# Patient Record
Sex: Female | Born: 2000 | Hispanic: Yes | Marital: Single | Smoking: Never smoker
Health system: Southern US, Community
[De-identification: ages and names within clinical notes are randomized; demographics above are authoritative.]

## PROBLEM LIST (undated history)

## (undated) ENCOUNTER — Inpatient Hospital Stay (HOSPITAL_COMMUNITY): Payer: Medicaid Other

## (undated) DIAGNOSIS — F32A Depression, unspecified: Secondary | ICD-10-CM

## (undated) DIAGNOSIS — F419 Anxiety disorder, unspecified: Secondary | ICD-10-CM

## (undated) DIAGNOSIS — D649 Anemia, unspecified: Secondary | ICD-10-CM

## (undated) DIAGNOSIS — I10 Essential (primary) hypertension: Secondary | ICD-10-CM

## (undated) HISTORY — PX: NO PAST SURGERIES: SHX2092

---
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
## 2019-03-05 NOTE — L&D Delivery Note (Addendum)
OB/GYN Faculty Practice Delivery Note  Paige Stevens is a 19 y.o. G1P0 s/p induced vaginal at [redacted]w[redacted]d. She was admitted for IOL for gHTN.   GBS Status: Negative/-- (10/05 0932) Maximum Maternal Temperature: Temp (24hrs), Avg:98.9 F (37.2 C), Min:98.3 F (36.8 C), Max:99.4 F (37.4 C)  Labor Progress:  Outpatient FB  SROM at home with clear fluid around 2200 on 12/15/19  cytotec x 1   Pitocin until complete dilation achieved.   Delivery Date/Time: 12/17/2019 at 0337 Delivery: Called to room and patient was complete and pushing. Head delivered ROA. Loose Nuchal cord present x 1. Shoulder and body delivered in usual fashion. Infant with spontaneous cry, placed skin to skin on mother's abdomen, dried and stimulated. Cord clamped x 2 after 1-minute delay, and cut by FOB. Cord blood drawn. Placenta delivered spontaneously with gentle cord traction. Fundus firm with massage and Pitocin. Labia, perineum, vagina, and cervix inspected with right periurethral, left periclitoral, left vaginal tear all repaired with 3-0 vicryl. .   Placenta: spontaneous , intact  Complications: hypertension EBL: 525 mL  Analgesia: Epidural anesthesia  Postpartum Planning Mom and baby to mother/baby.   Lactation consult  AM CBC  Contraception: POPs   Circ declined    Social work: hx of depression/anxiety    Infant: Viable baby boy   APGAR: 8/9   3005 g  Genia Hotter, M.D.  12/17/2019 4:28 AM   I saw and evaluated the patient. I agree with the findings and the plan of care as documented in the residents note.  Casper Harrison, MD [facility] Family Medicine Fellow, [facility] for [facility], [facility]

## 2019-05-27 LAB — OB RESULTS CONSOLE ABO/RH: RH Type: POSITIVE

## 2019-05-27 LAB — OB RESULTS CONSOLE HIV ANTIBODY (ROUTINE TESTING): HIV: NONREACTIVE

## 2019-05-27 LAB — OB RESULTS CONSOLE RPR: RPR: NONREACTIVE

## 2019-05-27 LAB — OB RESULTS CONSOLE HGB/HCT, BLOOD
HCT: 37 (ref 29–41)
Hemoglobin: 12.3

## 2019-05-27 LAB — OB RESULTS CONSOLE RUBELLA ANTIBODY, IGM: Rubella: IMMUNE

## 2019-05-27 LAB — OB RESULTS CONSOLE VARICELLA ZOSTER ANTIBODY, IGG: Varicella: NON-IMMUNE/NOT IMMUNE

## 2019-05-27 LAB — OB RESULTS CONSOLE HEPATITIS B SURFACE ANTIGEN: Hepatitis B Surface Ag: NEGATIVE

## 2019-08-23 ENCOUNTER — Other Ambulatory Visit: Payer: Self-pay

## 2019-08-23 ENCOUNTER — Inpatient Hospital Stay (HOSPITAL_BASED_OUTPATIENT_CLINIC_OR_DEPARTMENT_OTHER): Payer: Medicaid Other

## 2019-08-23 ENCOUNTER — Inpatient Hospital Stay (HOSPITAL_COMMUNITY)
Admission: AD | Admit: 2019-08-23 | Discharge: 2019-08-23 | Disposition: A | Discharge status: Home / Self Care | Payer: Medicaid Other | Attending: Obstetrics and Gynecology | Admitting: Obstetrics and Gynecology

## 2019-08-23 ENCOUNTER — Encounter (HOSPITAL_COMMUNITY): Payer: Self-pay | Admitting: Obstetrics and Gynecology

## 2019-08-23 DIAGNOSIS — Z3A20 20 weeks gestation of pregnancy: Secondary | ICD-10-CM

## 2019-08-23 DIAGNOSIS — O26892 Other specified pregnancy related conditions, second trimester: Secondary | ICD-10-CM | POA: Diagnosis not present

## 2019-08-23 DIAGNOSIS — O209 Hemorrhage in early pregnancy, unspecified: Secondary | ICD-10-CM | POA: Insufficient documentation

## 2019-08-23 DIAGNOSIS — O4692 Antepartum hemorrhage, unspecified, second trimester: Secondary | ICD-10-CM

## 2019-08-23 DIAGNOSIS — Z8249 Family history of ischemic heart disease and other diseases of the circulatory system: Secondary | ICD-10-CM | POA: Insufficient documentation

## 2019-08-23 DIAGNOSIS — Z8349 Family history of other endocrine, nutritional and metabolic diseases: Secondary | ICD-10-CM | POA: Insufficient documentation

## 2019-08-23 DIAGNOSIS — Z6740 Type O blood, Rh positive: Secondary | ICD-10-CM | POA: Insufficient documentation

## 2019-08-23 DIAGNOSIS — N930 Postcoital and contact bleeding: Secondary | ICD-10-CM

## 2019-08-23 DIAGNOSIS — O99891 Other specified diseases and conditions complicating pregnancy: Secondary | ICD-10-CM

## 2019-08-23 DIAGNOSIS — Z3492 Encounter for supervision of normal pregnancy, unspecified, second trimester: Secondary | ICD-10-CM

## 2019-08-23 DIAGNOSIS — R8290 Unspecified abnormal findings in urine: Secondary | ICD-10-CM | POA: Insufficient documentation

## 2019-08-23 DIAGNOSIS — N939 Abnormal uterine and vaginal bleeding, unspecified: Secondary | ICD-10-CM | POA: Diagnosis present

## 2019-08-23 DIAGNOSIS — Z3689 Encounter for other specified antenatal screening: Secondary | ICD-10-CM

## 2019-08-23 HISTORY — DX: Anxiety disorder, unspecified: F41.9

## 2019-08-23 HISTORY — DX: Depression, unspecified: F32.A

## 2019-08-23 LAB — CBC
HCT: 30.3 % — ABNORMAL LOW (ref 36.0–46.0)
Hemoglobin: 10.4 g/dL — ABNORMAL LOW (ref 12.0–15.0)
MCH: 31.4 pg (ref 26.0–34.0)
MCHC: 34.3 g/dL (ref 30.0–36.0)
MCV: 91.5 fL (ref 80.0–100.0)
Platelets: 281 10*3/uL (ref 150–400)
RBC: 3.31 MIL/uL — ABNORMAL LOW (ref 3.87–5.11)
RDW: 12.9 % (ref 11.5–15.5)
WBC: 10.1 10*3/uL (ref 4.0–10.5)
nRBC: 0 % (ref 0.0–0.2)

## 2019-08-23 LAB — URINALYSIS, ROUTINE W REFLEX MICROSCOPIC
Bilirubin Urine: NEGATIVE
Glucose, UA: NEGATIVE mg/dL
Hgb urine dipstick: NEGATIVE
Ketones, ur: NEGATIVE mg/dL
Nitrite: NEGATIVE
Protein, ur: NEGATIVE mg/dL
Specific Gravity, Urine: 1.005 (ref 1.005–1.030)
Trans Epithel, UA: <1
pH: 7 (ref 5.0–8.0)

## 2019-08-23 LAB — WET PREP, GENITAL
Clue Cells Wet Prep HPF POC: NONE SEEN
Sperm: NONE SEEN
Trich, Wet Prep: NONE SEEN
Yeast Wet Prep HPF POC: NONE SEEN

## 2019-08-23 LAB — TYPE AND SCREEN
ABO/RH(D): O POS
Antibody Screen: NEGATIVE

## 2019-08-23 LAB — ABO/RH: ABO/RH(D): O POS

## 2019-08-23 NOTE — Discharge Instructions (Signed)
Prenatal Care Providers           Center for [facility] @ [facility]   Phone: [phone]  Center for [facility] @ [facility]   Phone: [phone]  Center For [facility] @[facility] [place] [facility]       Phone: [phone]            Center for [facility] @ [place]     Phone: [phone]          Center for [facility] @ [facility]   Phone: [phone]  Center for [facility] @ [facility]  Phone: [phone]  Center for [facility] @ [facility] Phone: [phone]     [facility]  Phone: [phone]  [facility]  Phone: [phone]  [phone] OB/GYN Phone: [phone]  [facility] Phone: [phone]  [facility] Phone: [phone]  [facility] Phone: [phone]  [facility]  Phone: [phone]

## 2019-08-23 NOTE — MAU Note (Signed)
Presents with VB and "light" cramping.  Reports currently not bleeding, but did once this morning.  Noted blood in toilet and with wiping.  Reports last intercourse yesterday.  Denies LOF.

## 2019-08-23 NOTE — MAU Provider Note (Addendum)
History     CSN: [id number]  Arrival date and time: 08/23/19 1035   First Provider Initiated Contact with Patient 08/23/19 1250      Chief Complaint  Patient presents with  . Vaginal Bleeding  . Abdominal Pain   HPI Paige Stevens is a 19 y.o. G1P0 at [redacted]w[redacted]d who presents to MAU with chief complaint of vaginal bleeding. She endorses sexual intercourse last night followed by new onset vaginal spotting this morning. She states she sees pink-tinged fluid when she wipes after voiding. She denies pain, heavy vaginal bleeding,, dysuria, fever or recent illness.  Patient has moved to [place] from [place], where she initiated prenatal care. She does not have a local [facility] Provider and requests a list.  OB History    Gravida  1   Para      Term      Preterm      AB      Living        SAB      TAB      Ectopic      Multiple      Live Births              Past Medical History:  Diagnosis Date  . Anxiety   . Depression     Past Surgical History:  Procedure Laterality Date  . NO PAST SURGERIES      Family History  Problem Relation Age of Onset  . Hypothyroidism Mother   . Hypertension Father     Social History   Tobacco Use  . Smoking status: Never Smoker  . Smokeless tobacco: Never Used  Vaping Use  . Vaping Use: Never used  Substance Use Topics  . Alcohol use: Never  . Drug use: Yes    Types: Marijuana    Comment: 2020    Allergies: No Known Allergies  No medications prior to admission.    Review of Systems  Genitourinary: Positive for vaginal bleeding.  All other systems reviewed and are negative.  Physical Exam   Blood pressure 123/71, pulse (!) 114, temperature 98.4 F (36.9 C), temperature source Oral, resp. rate 18, height 5\' 3"  (1.6 m), weight 72 kg, SpO2 97 %.  Physical Exam  Nursing note and vitals reviewed. Constitutional: She appears well-developed.  Cardiovascular: Normal heart sounds. Tachycardia present.   Respiratory: Effort normal and breath sounds normal.  GI: Soft. There is no abdominal tenderness.  Gravid  Genitourinary:    Vagina and cervix normal.     No vaginal discharge.     Genitourinary Comments: Pelvic exam: External genitalia normal, vaginal walls pink and well rugated, cervix visually closed, no lesions noted. No bleeding or blood-tinged discharge visualized. No contact bleeding with swab collection.    Neurological: She is alert.    MAU Course  Procedures: speculum exam, ultrasound  --First trimester documentation visible in Care Everywhere. Reviewed by me  Patient Vitals for the past 24 hrs:  BP Temp Temp src Pulse Resp SpO2 Height Weight  08/23/19 1308 -- 98.4 F (36.9 C) Oral -- 18 97 % -- --  08/23/19 1056 123/71 98.3 F (36.8 C) Oral (!) 114 19 100 % -- --  08/23/19 1046 -- -- -- -- -- -- 5\' 3"  (1.6 m) 72 kg   Results for orders placed or performed during the hospital encounter of 08/23/19 (from the past 24 hour(s))  Urinalysis, Routine w reflex microscopic     Status: Abnormal   Collection Time: 08/23/19 11:15 AM  Result Value Ref Range   Color, Urine YELLOW YELLOW   APPearance HAZY (A) CLEAR   Specific Gravity, Urine 1.005 1.005 - 1.030   pH 7.0 5.0 - 8.0   Glucose, UA NEGATIVE NEGATIVE mg/dL   Hgb urine dipstick NEGATIVE NEGATIVE   Bilirubin Urine NEGATIVE NEGATIVE   Ketones, ur NEGATIVE NEGATIVE mg/dL   Protein, ur NEGATIVE NEGATIVE mg/dL   Nitrite NEGATIVE NEGATIVE   Leukocytes,Ua LARGE (A) NEGATIVE   RBC / HPF 0-5 0 - 5 RBC/hpf   WBC, UA 6-10 0 - 5 WBC/hpf   Bacteria, UA FEW (A) NONE SEEN   Squamous Epithelial / LPF 6-10 0 - 5   Trans Epithel, UA <1   Wet prep, genital     Status: Abnormal   Collection Time: 08/23/19 11:27 AM  Result Value Ref Range   Yeast Wet Prep HPF POC NONE SEEN NONE SEEN   Trich, Wet Prep NONE SEEN NONE SEEN   Clue Cells Wet Prep HPF POC NONE SEEN NONE SEEN   WBC, Wet Prep HPF POC MODERATE (A) NONE SEEN   Sperm NONE  SEEN   CBC     Status: Abnormal   Collection Time: 08/23/19 11:45 AM  Result Value Ref Range   WBC 10.1 4.0 - 10.5 K/uL   RBC 3.31 (L) 3.87 - 5.11 MIL/uL   Hemoglobin 10.4 (L) 12.0 - 15.0 g/dL   HCT 30.3 (L) 36 - 46 %   MCV 91.5 80.0 - 100.0 fL   MCH 31.4 26.0 - 34.0 pg   MCHC 34.3 30.0 - 36.0 g/dL   RDW 12.9 11.5 - 15.5 %   Platelets 281 150 - 400 K/uL   nRBC 0.0 0.0 - 0.2 %  Type and screen     Status: None   Collection Time: 08/23/19 11:46 AM  Result Value Ref Range   ABO/RH(D) O POS    Antibody Screen NEG    Sample Expiration      08/26/2019,2359 Performed at [facility], [address], [place], [place] [address]   ABO/Rh     Status: None   Collection Time: 08/23/19 11:46 AM  Result Value Ref Range   ABO/RH(D)      O POS Performed at [place] [address], [place], [place] [address]     Assessment and Plan  --19 y.o. G1P0 at [redacted]w[redacted]d  --FHT 160 by Doppler --No concerning findings on physical exam  --No concerning findings on MFM  OB Limited --Abnormal UA, urine culture ordered per patient request --Blood type O POS --Discharge home in stable condition  Darlina Rumpf, CNM 08/23/2019, 2:52 PM

## 2019-08-24 LAB — CULTURE, OB URINE: Culture: NO GROWTH

## 2019-08-24 LAB — GC/CHLAMYDIA PROBE AMP (~~LOC~~) NOT AT ARMC
Chlamydia: NEGATIVE
Comment: NEGATIVE
Comment: NORMAL
Neisseria Gonorrhea: NEGATIVE

## 2019-09-13 ENCOUNTER — Other Ambulatory Visit: Payer: Self-pay

## 2019-09-13 ENCOUNTER — Encounter: Payer: Self-pay | Admitting: Advanced Practice Midwife

## 2019-09-13 ENCOUNTER — Ambulatory Visit (INDEPENDENT_AMBULATORY_CARE_PROVIDER_SITE_OTHER): Payer: Medicaid Other | Admitting: Advanced Practice Midwife

## 2019-09-13 VITALS — BP 122/77 | HR 103 | Wt 162.8 lb

## 2019-09-13 DIAGNOSIS — Z3482 Encounter for supervision of other normal pregnancy, second trimester: Secondary | ICD-10-CM

## 2019-09-13 DIAGNOSIS — Z3A23 23 weeks gestation of pregnancy: Secondary | ICD-10-CM

## 2019-09-13 DIAGNOSIS — Z3A24 24 weeks gestation of pregnancy: Secondary | ICD-10-CM | POA: Diagnosis not present

## 2019-09-13 DIAGNOSIS — Z3480 Encounter for supervision of other normal pregnancy, unspecified trimester: Secondary | ICD-10-CM | POA: Insufficient documentation

## 2019-09-13 MED ORDER — BLOOD PRESSURE KIT DEVI: 1.0000 | 0 refills | Status: DC | PRN

## 2019-09-13 NOTE — Progress Notes (Signed)
NOB transferred from [facility] - records available  Needs Anatomy u/s

## 2019-09-13 NOTE — Patient Instructions (Signed)

## 2019-09-13 NOTE — Progress Notes (Signed)
Subjective:   Paige Stevens is a 19 y.o. G1P0 at [redacted]w[redacted]d by LMP, c/w 8 week [place] being seen today for her first obstetrical visit as a transfer from [place].  Her obstetrical history is significant for none and has Abnormal thyroid function test; Family history of thyroid cancer; and Supervision of other normal pregnancy, antepartum on their problem list.. Patient does intend to breast feed. Pregnancy history fully reviewed.  Patient reports no complaints.  HISTORY: OB History  Gravida Para Term Preterm AB Living  1 0 0 0 0 0  SAB TAB Ectopic Multiple Live Births  0 0 0 0 0    # Outcome Date GA Lbr Len/2nd Weight Sex Delivery Anes PTL Lv  1 Current            Past Medical History:  Diagnosis Date  . Anxiety   . Depression    Past Surgical History:  Procedure Laterality Date  . NO PAST SURGERIES     Family History  Problem Relation Age of Onset  . Hypothyroidism Mother   . Hypertension Father    Social History   Tobacco Use  . Smoking status: Never Smoker  . Smokeless tobacco: Never Used  Vaping Use  . Vaping Use: Never used  Substance Use Topics  . Alcohol use: Never  . Drug use: Yes    Types: Marijuana    Comment: 2020   No Known Allergies Current Outpatient Medications on File Prior to Visit  Medication Sig Dispense Refill  . Prenatal Vit-Fe Fumarate-FA (MULTIVITAMIN-PRENATAL) 27-0.8 MG TABS tablet Take 1 tablet by mouth daily at 12 noon.     No current facility-administered medications on file prior to visit.    Transfer from [place] at 24 weeks, late to consider early GTT and/or ASA therapy.    Exam   Vitals:   09/13/19 0811  BP: 122/77  Pulse: (!) 103  Weight: 162 lb 12.8 oz (73.8 kg)      Uterus:     Pelvic Exam: Perineum: no hemorrhoids, normal perineum   Vulva: normal external genitalia, no lesions   Vagina:  normal mucosa, normal discharge   Cervix: no lesions and normal, pap smear done.    Adnexa: normal adnexa and no mass,  fullness, tenderness   Bony Pelvis: average  System: General: well-developed, well-nourished female in no acute distress   Breast:  normal appearance, no masses or tenderness   Skin: normal coloration and turgor, no rashes   Neurologic: oriented, normal, negative, normal mood   Extremities: normal strength, tone, and muscle mass, ROM of all joints is normal   HEENT PERRLA, extraocular movement intact and sclera clear, anicteric   Mouth/Teeth mucous membranes moist, pharynx normal without lesions and dental hygiene good   Neck supple and no masses   Cardiovascular: regular rate and rhythm   Respiratory:  no respiratory distress, normal breath sounds   Abdomen: soft, non-tender; bowel sounds normal; no masses,  no organomegaly     Assessment:   Pregnancy: G1P0 Patient Active Problem List   Diagnosis Date Noted  . Supervision of other normal pregnancy, antepartum 09/13/2019  . Abnormal thyroid function test 11/13/2015  . Family history of thyroid cancer 11/13/2015     Plan:  1. Supervision of other normal pregnancy, antepartum --Anticipatory guidance about next visits/weeks of pregnancy given. --Next visit in 4 weeks for GTT --Review of chart and confirmation with patient reveals sure LMP with 8 week [place] within 5 days so EDD changed to  01/01/20  - Enroll Patient in Babyscripts - [place] MFM OB COMP + 14 WK; Future   Reviewed initial OB labs and genetic screening results from transfer records.  Continue prenatal vitamins. Ultrasound discussed; fetal anatomic survey: ordered. Problem list reviewed and updated. The nature of [facility] Faculty Practice with multiple MDs and other Advanced Practice Providers was explained to patient; also emphasized that residents, students are part of our team. Routine obstetric precautions reviewed. Return in about 4 weeks (around 10/11/2019).   Sharen Counter, CNM 09/13/19 8:59 AM

## 2019-09-20 ENCOUNTER — Other Ambulatory Visit: Payer: Self-pay | Admitting: *Deleted

## 2019-09-20 ENCOUNTER — Other Ambulatory Visit: Payer: Self-pay

## 2019-09-20 ENCOUNTER — Ambulatory Visit: Payer: Medicaid Other | Attending: Obstetrics and Gynecology

## 2019-09-20 DIAGNOSIS — Z363 Encounter for antenatal screening for malformations: Secondary | ICD-10-CM | POA: Diagnosis not present

## 2019-09-20 DIAGNOSIS — Z3480 Encounter for supervision of other normal pregnancy, unspecified trimester: Secondary | ICD-10-CM | POA: Insufficient documentation

## 2019-09-20 DIAGNOSIS — O2692 Pregnancy related conditions, unspecified, second trimester: Secondary | ICD-10-CM | POA: Diagnosis not present

## 2019-09-20 DIAGNOSIS — Z3A23 23 weeks gestation of pregnancy: Secondary | ICD-10-CM | POA: Insufficient documentation

## 2019-09-20 DIAGNOSIS — O99342 Other mental disorders complicating pregnancy, second trimester: Secondary | ICD-10-CM | POA: Diagnosis not present

## 2019-09-20 DIAGNOSIS — Z3A25 25 weeks gestation of pregnancy: Secondary | ICD-10-CM

## 2019-09-20 DIAGNOSIS — Z362 Encounter for other antenatal screening follow-up: Secondary | ICD-10-CM

## 2019-09-20 DIAGNOSIS — F99 Mental disorder, not otherwise specified: Secondary | ICD-10-CM | POA: Diagnosis not present

## 2019-10-11 ENCOUNTER — Other Ambulatory Visit: Payer: Medicaid Other

## 2019-10-11 ENCOUNTER — Ambulatory Visit (INDEPENDENT_AMBULATORY_CARE_PROVIDER_SITE_OTHER): Payer: Medicaid Other | Admitting: Advanced Practice Midwife

## 2019-10-11 ENCOUNTER — Other Ambulatory Visit: Payer: Self-pay

## 2019-10-11 VITALS — BP 113/79 | HR 103 | Temp 99.7°F | Wt 167.8 lb

## 2019-10-11 DIAGNOSIS — R102 Pelvic and perineal pain: Secondary | ICD-10-CM

## 2019-10-11 DIAGNOSIS — Z3A28 28 weeks gestation of pregnancy: Secondary | ICD-10-CM

## 2019-10-11 DIAGNOSIS — Z7189 Other specified counseling: Secondary | ICD-10-CM | POA: Diagnosis not present

## 2019-10-11 DIAGNOSIS — Z3480 Encounter for supervision of other normal pregnancy, unspecified trimester: Secondary | ICD-10-CM

## 2019-10-11 DIAGNOSIS — R109 Unspecified abdominal pain: Secondary | ICD-10-CM

## 2019-10-11 DIAGNOSIS — O26893 Other specified pregnancy related conditions, third trimester: Secondary | ICD-10-CM

## 2019-10-11 DIAGNOSIS — Z3483 Encounter for supervision of other normal pregnancy, third trimester: Secondary | ICD-10-CM

## 2019-10-11 DIAGNOSIS — O99013 Anemia complicating pregnancy, third trimester: Secondary | ICD-10-CM

## 2019-10-11 DIAGNOSIS — R509 Fever, unspecified: Secondary | ICD-10-CM

## 2019-10-11 NOTE — Patient Instructions (Addendum)
Please review the COVID 19 Vaccine information concerning pregnancy and breastfeeding from the CDC: [email or web address].html     Third Trimester of Pregnancy The third trimester is from week 28 through week 40 (months 7 through 9). The third trimester is a time when the unborn baby (fetus) is growing rapidly. At the end of the ninth month, the fetus is about 20 inches in length and weighs 6-10 pounds. Body changes during your third trimester Your body will continue to go through many changes during pregnancy. The changes vary from woman to woman. During the third trimester:  Your weight will continue to increase. You can expect to gain 25-35 pounds (11-16 kg) by the end of the pregnancy.  You may begin to get stretch marks on your hips, abdomen, and breasts.  You may urinate more often because the fetus is moving lower into your pelvis and pressing on your bladder.  You may develop or continue to have heartburn. This is caused by increased hormones that slow down muscles in the digestive tract.  You may develop or continue to have constipation because increased hormones slow digestion and cause the muscles that push waste through your intestines to relax.  You may develop hemorrhoids. These are swollen veins (varicose veins) in the rectum that can itch or be painful.  You may develop swollen, bulging veins (varicose veins) in your legs.  You may have increased body aches in the pelvis, back, or thighs. This is due to weight gain and increased hormones that are relaxing your joints.  You may have changes in your hair. These can include thickening of your hair, rapid growth, and changes in texture. Some women also have hair loss during or after pregnancy, or hair that feels dry or thin. Your hair will most likely return to normal after your baby is born.  Your breasts will continue to grow and they will continue to become tender. A  yellow fluid (colostrum) may leak from your breasts. This is the first milk you are producing for your baby.  Your belly button may stick out.  You may notice more swelling in your hands, face, or ankles.  You may have increased tingling or numbness in your hands, arms, and legs. The skin on your belly may also feel numb.  You may feel short of breath because of your expanding uterus.  You may have more problems sleeping. This can be caused by the size of your belly, increased need to urinate, and an increase in your body's metabolism.  You may notice the fetus "dropping," or moving lower in your abdomen (lightening).  You may have increased vaginal discharge.  You may notice your joints feel loose and you may have pain around your pelvic bone. What to expect at prenatal visits You will have prenatal exams every 2 weeks until week 36. Then you will have weekly prenatal exams. During a routine prenatal visit:  You will be weighed to make sure you and the baby are growing normally.  Your blood pressure will be taken.  Your abdomen will be measured to track your baby's growth.  The fetal heartbeat will be listened to.  Any test results from the previous visit will be discussed.  You may have a cervical check near your due date to see if your cervix has softened or thinned (effaced).  You will be tested for Group B streptococcus. This happens between 35 and 37 weeks. Your health care provider may ask you:  What your birth plan is.  How you are feeling.  If you are feeling the baby move.  If you have had any abnormal symptoms, such as leaking fluid, bleeding, severe headaches, or abdominal cramping.  If you are using any tobacco products, including cigarettes, chewing tobacco, and electronic cigarettes.  If you have any questions. Other tests or screenings that may be performed during your third trimester include:  Blood tests that check for low iron levels  (anemia).  Fetal testing to check the health, activity level, and growth of the fetus. Testing is done if you have certain medical conditions or if there are problems during the pregnancy.  Nonstress test (NST). This test checks the health of your baby to make sure there are no signs of problems, such as the baby not getting enough oxygen. During this test, a belt is placed around your belly. The baby is made to move, and its heart rate is monitored during movement. What is false labor? False labor is a condition in which you feel small, irregular tightenings of the muscles in the womb (contractions) that usually go away with rest, changing position, or drinking water. These are called Braxton Hicks contractions. Contractions may last for hours, days, or even weeks before true labor sets in. If contractions come at regular intervals, become more frequent, increase in intensity, or become painful, you should see your health care provider. What are the signs of labor?  Abdominal cramps.  Regular contractions that start at 10 minutes apart and become stronger and more frequent with time.  Contractions that start on the top of the uterus and spread down to the lower abdomen and back.  Increased pelvic pressure and dull back pain.  A watery or bloody mucus discharge that comes from the vagina.  Leaking of amniotic fluid. This is also known as your "water breaking." It could be a slow trickle or a gush. Let your health care provider know if it has a color or strange odor. If you have any of these signs, call your health care provider right away, even if it is before your due date. Follow these instructions at home: Medicines  Follow your health care provider's instructions regarding medicine use. Specific medicines may be either safe or unsafe to take during pregnancy.  Take a prenatal vitamin that contains at least 600 micrograms (mcg) of folic acid.  If you develop constipation, try taking a  stool softener if your health care provider approves. Eating and drinking   Eat a balanced diet that includes fresh fruits and vegetables, whole grains, good sources of protein such as meat, eggs, or tofu, and low-fat dairy. Your health care provider will help you determine the amount of weight gain that is right for you.  Avoid raw meat and uncooked cheese. These carry germs that can cause birth defects in the baby.  If you have low calcium intake from food, talk to your health care provider about whether you should take a daily calcium supplement.  Eat four or five small meals rather than three large meals a day.  Limit foods that are high in fat and processed sugars, such as fried and sweet foods.  To prevent constipation: ? Drink enough fluid to keep your urine clear or pale yellow. ? Eat foods that are high in fiber, such as fresh fruits and vegetables, whole grains, and beans. Activity  Exercise only as directed by your health care provider. Most women can continue their usual exercise routine during pregnancy. Try to exercise for 30 minutes  at least 5 days a week. Stop exercising if you experience uterine contractions.  Avoid heavy lifting.  Do not exercise in extreme heat or humidity, or at high altitudes.  Wear low-heel, comfortable shoes.  Practice good posture.  You may continue to have sex unless your health care provider tells you otherwise. Relieving pain and discomfort  Take frequent breaks and rest with your legs elevated if you have leg cramps or low back pain.  Take warm sitz baths to soothe any pain or discomfort caused by hemorrhoids. Use hemorrhoid cream if your health care provider approves.  Wear a good support bra to prevent discomfort from breast tenderness.  If you develop varicose veins: ? Wear support pantyhose or compression stockings as told by your healthcare provider. ? Elevate your feet for 15 minutes, 3-4 times a day. Prenatal care  Write  down your questions. Take them to your prenatal visits.  Keep all your prenatal visits as told by your health care provider. This is important. Safety  Wear your seat belt at all times when driving.  Make a list of emergency phone numbers, including numbers for family, friends, the hospital, and police and fire departments. General instructions  Avoid cat litter boxes and soil used by cats. These carry germs that can cause birth defects in the baby. If you have a cat, ask someone to clean the litter box for you.  Do not travel far distances unless it is absolutely necessary and only with the approval of your health care provider.  Do not use hot tubs, steam rooms, or saunas.  Do not drink alcohol.  Do not use any products that contain nicotine or tobacco, such as cigarettes and e-cigarettes. If you need help quitting, ask your health care provider.  Do not use any medicinal herbs or unprescribed drugs. These chemicals affect the formation and growth of the baby.  Do not douche or use tampons or scented sanitary pads.  Do not cross your legs for long periods of time.  To prepare for the arrival of your baby: ? Take prenatal classes to understand, practice, and ask questions about labor and delivery. ? Make a trial run to the hospital. ? Visit the hospital and tour the maternity area. ? Arrange for maternity or paternity leave through employers. ? Arrange for family and friends to take care of pets while you are in the hospital. ? Purchase a rear-facing car seat and make sure you know how to install it in your car. ? Pack your hospital bag. ? Prepare the babys nursery. Make sure to remove all pillows and stuffed animals from the baby's crib to prevent suffocation.  Visit your dentist if you have not gone during your pregnancy. Use a soft toothbrush to brush your teeth and be gentle when you floss. Contact a health care provider if:  You are unsure if you are in labor or if your  water has broken.  You become dizzy.  You have mild pelvic cramps, pelvic pressure, or nagging pain in your abdominal area.  You have lower back pain.  You have persistent nausea, vomiting, or diarrhea.  You have an unusual or bad smelling vaginal discharge.  You have pain when you urinate. Get help right away if:  Your water breaks before 37 weeks.  You have regular contractions less than 5 minutes apart before 37 weeks.  You have a fever.  You are leaking fluid from your vagina.  You have spotting or bleeding from your vagina.  You have severe abdominal pain or cramping.  You have rapid weight loss or weight gain.  You have shortness of breath with chest pain.  You notice sudden or extreme swelling of your face, hands, ankles, feet, or legs.  Your baby makes fewer than 10 movements in 2 hours.  You have severe headaches that do not go away when you take medicine.  You have vision changes. Summary  The third trimester is from week 28 through week 40, months 7 through 9. The third trimester is a time when the unborn baby (fetus) is growing rapidly.  During the third trimester, your discomfort may increase as you and your baby continue to gain weight. You may have abdominal, leg, and back pain, sleeping problems, and an increased need to urinate.  During the third trimester your breasts will keep growing and they will continue to become tender. A yellow fluid (colostrum) may leak from your breasts. This is the first milk you are producing for your baby.  False labor is a condition in which you feel small, irregular tightenings of the muscles in the womb (contractions) that eventually go away. These are called Braxton Hicks contractions. Contractions may last for hours, days, or even weeks before true labor sets in.  Signs of labor can include: abdominal cramps; regular contractions that start at 10 minutes apart and become stronger and more frequent with time; watery or  bloody mucus discharge that comes from the vagina; increased pelvic pressure and dull back pain; and leaking of amniotic fluid. This information is not intended to replace advice given to you by your health care provider. Make sure you discuss any questions you have with your health care provider. Document Revised: 06/11/2018 Document Reviewed: 03/26/2016 Elsevier Patient Education  2020 Elsevier

## 2019-10-11 NOTE — Progress Notes (Addendum)
PRENATAL VISIT NOTE  Subjective:  Paige Stevens is a 19 y.o. G1P0 at [redacted]w[redacted]d being seen today for ongoing prenatal care.  She is currently monitored for the following issues for this low-risk pregnancy and has Abnormal thyroid function test; Family history of thyroid cancer; and Supervision of other normal pregnancy, antepartum on their problem list.  Patient reports occasional contractions.  Contractions: Not present. Vag. Bleeding: None.  Movement: Present. Denies leaking of fluid.   The following portions of the patient's history were reviewed and updated as appropriate: allergies, current medications, past family history, past medical history, past social history, past surgical history and problem list.   Objective:   Vitals:   10/11/19 0811  BP: 113/79  Pulse: (!) 103  Temp: 99.7 F (37.6 C)  Weight: 167 lb 12.8 oz (76.1 kg)    Fetal Status: Fetal Heart Rate (bpm): 143   Movement: Present     General:  Alert, oriented and cooperative. Patient is in no acute distress.  Skin: Skin is warm and dry. No rash noted.   Cardiovascular: Normal heart rate noted  Respiratory: Normal respiratory effort, no problems with respiration noted  Abdomen: Soft, gravid, appropriate for gestational age.  Pain/Pressure: Present     Pelvic: Cervical exam deferred        Extremities: Normal range of motion.  Edema: None  Mental Status: Normal mood and affect. Normal behavior. Normal judgment and thought content.   Assessment and Plan:  Pregnancy: G1P0 at [redacted]w[redacted]d 1. Supervision of other normal pregnancy, antepartum --Anticipatory guidance about next visits/weeks of pregnancy given. --Pt works as Public librarian, is in school, and has questions about safety of waxing, teeth whitening, etc. In school environment, students often perform procedures like waxing on each other and classmates have been reluctant to perform these for her during pregnancy. Letter provided for patient to have usual  aesthetic procedures for her school program and otherwise.   --Pt has questions about COVID 19 vaccine and pregnancy.  Discussed limited data but good safety profile of vaccine in pregnancy and referred pt to [facility] website to review information about vaccine and pregnancy/breastfeeding. --The patient was counseled on the potential benefits and lack of known risks of COVID vaccination, during pregnancy and breastfeeding, on today's visit. The patient's questions and concerns were addressed today, including safety/timing. The patient is still unsure of her decision for vaccination. The patient is aware that if she chooses not to get an employee mandated vaccination we will provide documentation for her employers in the form of a letter unless a specific exemption form is submitted to the provider. The patient is aware that this documentation is a deferment of a mandated vaccination that will need to be renewed by a provider n/a.      --Discussed specific risks of COVID 19 and pregnancy, especially during the third trimester. Questions answered.   2. Abdominal pain during pregnancy in third trimester --Pt with sharp pain in lower abdomen upon waking each morning, worse this morning when stretching.  Likely round ligament pain. --Rest/ice/heat/warm bath/Tylenol/pregnancy support belt --Urine dip/culture today given pain and low grade temp of 99.7. Pt without other s/sx of UTI and without chills or other signs of systemic infection.  3. [redacted] weeks gestation of pregnancy   Preterm labor symptoms and general obstetric precautions including but not limited to vaginal bleeding, contractions, leaking of fluid and fetal movement were reviewed in detail with the patient. Please refer to After Visit Summary for other counseling recommendations.   Return  in about 4 weeks (around 11/08/2019).  Future Appointments  Date Time Provider Department Center  10/18/2019  7:15 AM [facility] NURSE [facility]  10/18/2019   7:30 AM [facility] US3 [facility]  11/09/2019  8:15 AM Raelyn Mora, CNM [facility] None    Sharen Counter, CNM

## 2019-10-12 LAB — GLUCOSE TOLERANCE, 2 HOURS W/ 1HR
Glucose, 1 hour: 121 mg/dL (ref 65–179)
Glucose, 2 hour: 92 mg/dL (ref 65–152)
Glucose, Fasting: 84 mg/dL (ref 65–91)

## 2019-10-12 LAB — CBC
Hematocrit: 28.6 % — ABNORMAL LOW (ref 34.0–46.6)
Hemoglobin: 9.6 g/dL — ABNORMAL LOW (ref 11.1–15.9)
MCH: 30.1 pg (ref 26.6–33.0)
MCHC: 33.6 g/dL (ref 31.5–35.7)
MCV: 90 fL (ref 79–97)
Platelets: 304 10*3/uL (ref 150–450)
RBC: 3.19 x10E6/uL — ABNORMAL LOW (ref 3.77–5.28)
RDW: 12 % (ref 11.7–15.4)
WBC: 9.2 10*3/uL (ref 3.4–10.8)

## 2019-10-12 LAB — HEPATITIS C ANTIBODY: Hep C Virus Ab: <0.1 s/co ratio (ref 0.0–0.9)

## 2019-10-12 LAB — HIV ANTIBODY (ROUTINE TESTING W REFLEX): HIV Screen 4th Generation wRfx: NONREACTIVE

## 2019-10-12 LAB — RPR: RPR Ser Ql: NONREACTIVE

## 2019-10-14 ENCOUNTER — Telehealth: Payer: Self-pay

## 2019-10-14 LAB — URINE CULTURE, OB REFLEX

## 2019-10-14 LAB — CULTURE, OB URINE

## 2019-10-14 MED ORDER — FERROUS SULFATE 325 (65 FE) MG PO TABS: 325.0000 mg | ORAL_TABLET | ORAL | 2 refills | Status: DC

## 2019-10-14 NOTE — Addendum Note (Signed)
Addended by: Sharen Counter A on: 10/14/2019 12:12 AM   Modules accepted: Orders

## 2019-10-14 NOTE — Telephone Encounter (Signed)
Called to advise of results and rx, no answer, left vm ?

## 2019-10-15 ENCOUNTER — Telehealth: Payer: Self-pay

## 2019-10-15 MED ORDER — PRENATE MINI 18-0.6-0.4-350 MG PO CAPS: 1.0000 | ORAL_CAPSULE | Freq: Every day | ORAL | 10 refills | Status: DC

## 2019-10-15 NOTE — Telephone Encounter (Signed)
Returned call and advised of results and rx sent.

## 2019-10-18 ENCOUNTER — Encounter: Payer: Self-pay | Admitting: *Deleted

## 2019-10-18 ENCOUNTER — Other Ambulatory Visit: Payer: Self-pay

## 2019-10-18 ENCOUNTER — Ambulatory Visit: Payer: Medicaid Other | Attending: Obstetrics and Gynecology

## 2019-10-18 ENCOUNTER — Ambulatory Visit: Payer: Medicaid Other

## 2019-10-18 ENCOUNTER — Other Ambulatory Visit: Payer: Self-pay | Admitting: *Deleted

## 2019-10-18 ENCOUNTER — Ambulatory Visit: Payer: Medicaid Other | Admitting: *Deleted

## 2019-10-18 VITALS — BP 118/75 | HR 83

## 2019-10-18 DIAGNOSIS — Z363 Encounter for antenatal screening for malformations: Secondary | ICD-10-CM | POA: Diagnosis not present

## 2019-10-18 DIAGNOSIS — F99 Mental disorder, not otherwise specified: Secondary | ICD-10-CM | POA: Diagnosis not present

## 2019-10-18 DIAGNOSIS — Z362 Encounter for other antenatal screening follow-up: Secondary | ICD-10-CM | POA: Diagnosis not present

## 2019-10-18 DIAGNOSIS — O99343 Other mental disorders complicating pregnancy, third trimester: Secondary | ICD-10-CM

## 2019-10-18 DIAGNOSIS — Z3A29 29 weeks gestation of pregnancy: Secondary | ICD-10-CM

## 2019-10-18 DIAGNOSIS — H0469 Other changes of lacrimal passages: Secondary | ICD-10-CM

## 2019-11-09 ENCOUNTER — Encounter: Payer: Self-pay | Admitting: Obstetrics and Gynecology

## 2019-11-09 ENCOUNTER — Ambulatory Visit (INDEPENDENT_AMBULATORY_CARE_PROVIDER_SITE_OTHER): Payer: Medicaid Other | Admitting: Obstetrics and Gynecology

## 2019-11-09 ENCOUNTER — Other Ambulatory Visit: Payer: Self-pay

## 2019-11-09 VITALS — BP 126/85 | HR 87 | Wt 174.0 lb

## 2019-11-09 DIAGNOSIS — Z23 Encounter for immunization: Secondary | ICD-10-CM | POA: Diagnosis not present

## 2019-11-09 DIAGNOSIS — Z3A32 32 weeks gestation of pregnancy: Secondary | ICD-10-CM | POA: Diagnosis not present

## 2019-11-09 DIAGNOSIS — Z3480 Encounter for supervision of other normal pregnancy, unspecified trimester: Secondary | ICD-10-CM

## 2019-11-09 DIAGNOSIS — O99013 Anemia complicating pregnancy, third trimester: Secondary | ICD-10-CM

## 2019-11-09 NOTE — Progress Notes (Signed)
Pt presents for routine OB visit.  Pt discontinued iron supplements, causes her to vomit.

## 2019-11-09 NOTE — Progress Notes (Signed)
LOW-RISK PREGNANCY OFFICE VISIT Patient name: Paige Stevens MRN [id number]  Date of birth: 08-Jul-2000 Chief Complaint:   Routine Prenatal Visit  History of Present Illness:   Paige Stevens is a 19 y.o. G1P0 female at [redacted]w[redacted]d with an Estimated Date of Delivery: 01/01/20 being seen today for ongoing management of a low-risk pregnancy.  Today she reports some Braxton-Hicks contractions. She also wanted to know if [place] requires patients to have COVID-19 vaccine. She reports that she is unable to tolerate the iron pills that were prescribed to her. She reports that every time she takes them, she vomits. She states that before receiving the prescription for iron, she was taking the [employer] brand iron supplements and tolerated those just fine. Contractions: Irritability. Vag. Bleeding: None.  Movement: Present. denies leaking of fluid. Review of Systems:   Pertinent items are noted in HPI Denies abnormal vaginal discharge w/ itching/odor/irritation, headaches, visual changes, shortness of breath, chest pain, abdominal pain, severe nausea/vomiting, or problems with urination or bowel movements unless otherwise stated above. Pertinent History Reviewed:  Reviewed past medical,surgical, social, obstetrical and family history.  Reviewed problem list, medications and allergies. Physical Assessment:   Vitals:   11/09/19 0811 11/09/19 0815  BP: 138/82 126/85  Pulse: 87   Weight: 174 lb (78.9 kg)   Body mass index is 30.82 kg/m.        Physical Examination:   General appearance: Well appearing, and in no distress  Mental status: Alert, oriented to person, place, and time  Skin: Warm & dry  Cardiovascular: Normal heart rate noted  Respiratory: Normal respiratory effort, no distress  Abdomen: Soft, gravid, nontender  Pelvic: Cervical exam deferred         Extremities: Edema: None  Fetal Status: Fetal Heart Rate (bpm): 138 Fundal Height: 33 cm Movement: Present    No  results found for this or any previous visit (from the past 24 hour(s)).  Assessment & Plan:  1) Low-risk pregnancy G1P0 at [redacted]w[redacted]d with an Estimated Date of Delivery: 01/01/20   2) Supervision of other normal pregnancy, antepartum  - Tdap vaccine greater than or equal to 7yo IM,  - Flu Vaccine QUAD 36+ mos IM (Fluarix, Quad PF) - Discussed returning to office in 2 weeks - Provided patient with the COVID-19 FAQs - Pt has questions about COVID 19 vaccine and pregnancy.  Discussed limited data but good safety profile of vaccine in pregnancy and referred pt to [facility] website to review information about vaccine and pregnancy/breastfeeding. - The patient was counseled on the potential benefits and lack of known risks of COVID vaccination, during pregnancy and breastfeeding, on today's visit. The patient's questions and concerns were addressed today, including safety/timing. The patient is still unsure of her decision for vaccination. The patient is aware that if she chooses not to get an employee mandated vaccination we will provide documentation for her employers in the form of a letter unless a specific exemption form is submitted to the provider. The patient is aware that this documentation is a deferment of a mandated vaccination that will need to be renewed by a provider.  - Discussed specific risks of COVID 19 and pregnancy, especially during the third trimester. Questions answered.  3) [redacted] weeks gestation of pregnancy  4) Anemia affecting pregnancy in third trimester - Advised to resume [employer] brand iron supplementation since unable to tolerate the Rx iron that given to her   Meds: No orders of the defined types were placed in this encounter.  Labs/procedures today: TdaP and Flu vaccine  Plan:  Continue routine obstetrical care   Reviewed: Preterm labor symptoms and general obstetric precautions including but not limited to vaginal bleeding, contractions, leaking of fluid and fetal movement were  reviewed in detail with the patient.  All questions were answered.   Follow-up: Return in about 2 weeks (around 11/23/2019) for Return OB visit.  Orders Placed This Encounter  Procedures  . Tdap vaccine greater than or equal to 7yo IM  . Flu Vaccine QUAD 36+ mos IM (Fluarix, Quad PF)   Raelyn Mora MSN, CNM 11/09/2019 8:24 AM

## 2019-11-09 NOTE — Patient Instructions (Addendum)
Iron-Rich Diet  Iron is a mineral that helps your body to produce hemoglobin. Hemoglobin is a protein in red blood cells that carries oxygen to your body's tissues. Eating too little iron may cause you to feel weak and tired, and it can increase your risk of infection. Iron is naturally found in many foods, and many foods have iron added to them (iron-fortified foods). You may need to follow an iron-rich diet if you do not have enough iron in your body due to certain medical conditions. The amount of iron that you need each day depends on your age, your sex, and any medical conditions you have. Follow instructions from your health care provider or a diet and nutrition specialist (dietitian) about how much iron you should eat each day. What are tips for following this plan? Reading food labels  Check food labels to see how many milligrams (mg) of iron are in each serving. Cooking  Cook foods in pots and pans that are made from iron.  Take these steps to make it easier for your body to absorb iron from certain foods: ? Soak beans overnight before cooking. ? Soak whole grains overnight and drain them before using. ? Ferment flours before baking, such as by using yeast in bread dough. Meal planning  When you eat foods that contain iron, you should eat them with foods that are high in vitamin C. These include oranges, peppers, tomatoes, potatoes, and mango. Vitamin C helps your body to absorb iron. General information  Take iron supplements only as told by your health care provider. An overdose of iron can be life-threatening. If you were prescribed iron supplements, take them with orange juice or a vitamin C supplement.  When you eat iron-fortified foods or take an iron supplement, you should also eat foods that naturally contain iron, such as meat, poultry, and fish. Eating naturally iron-rich foods helps your body to absorb the iron that is added to other foods or contained in a  supplement.  Certain foods and drinks prevent your body from absorbing iron properly. Avoid eating these foods in the same meal as iron-rich foods or with iron supplements. These foods include: ? Coffee, black tea, and red wine. ? Milk, dairy products, and foods that are high in calcium. ? Beans and soybeans. ? Whole grains. What foods should I eat? Fruits Prunes. Raisins. Eat fruits high in vitamin C, such as oranges, grapefruits, and strawberries, alongside iron-rich foods. Vegetables Spinach (cooked). Green peas. Broccoli. Fermented vegetables. Eat vegetables high in vitamin C, such as leafy greens, potatoes, bell peppers, and tomatoes, alongside iron-rich foods. Grains Iron-fortified breakfast cereal. Iron-fortified whole-wheat bread. Enriched rice. Sprouted grains. Meats and other proteins Beef liver. Oysters. Beef. Shrimp. Turkey. Chicken. Tuna. Sardines. Chickpeas. Nuts. Tofu. Pumpkin seeds. Beverages Tomato juice. Fresh orange juice. Prune juice. Hibiscus tea. Fortified instant breakfast shakes. Sweets and desserts Blackstrap molasses. Seasonings and condiments Tahini. Fermented soy sauce. Other foods Wheat germ. The items listed above may not be a complete list of recommended foods and beverages. Contact a dietitian for more information. What foods should I avoid? Grains Whole grains. Bran cereal. Bran flour. Oats. Meats and other proteins Soybeans. Products made from soy protein. Black beans. Lentils. Mung beans. Split peas. Dairy Milk. Cream. Cheese. Yogurt. Cottage cheese. Beverages Coffee. Black tea. Red wine. Sweets and desserts Cocoa. Chocolate. Ice cream. Other foods Basil. Oregano. Large amounts of parsley. The items listed above may not be a complete list of foods and beverages to avoid.   Contact a dietitian for more information. Summary  Iron is a mineral that helps your body to produce hemoglobin. Hemoglobin is a protein in red blood cells that carries  oxygen to your body's tissues.  Iron is naturally found in many foods, and many foods have iron added to them (iron-fortified foods).  When you eat foods that contain iron, you should eat them with foods that are high in vitamin C. Vitamin C helps your body to absorb iron.  Certain foods and drinks prevent your body from absorbing iron properly, such as whole grains and dairy products. You should avoid eating these foods in the same meal as iron-rich foods or with iron supplements. This information is not intended to replace advice given to you by your health care provider. Make sure you discuss any questions you have with your health care provider. Document Revised: 01/31/2017 Document Reviewed: 01/14/2017 Elsevier Patient Education  [employer].   It is encouraged that pregnant patients receive the COVID-19 vaccine to help alleviate the severe symptoms and the potential bad outcomes as a result of COVID-19  Coronavirus (COVID-19) and Pregnancy:  Frequently Asked Questions   How might coronavirus affect my pregnancy? The data for COVID-19 is limited, but we know that women with other coronavirus infections (such as SARS-CoV) did NOT have miscarriage or stillbirth at higher rates than the general population.  On the other hand, we know that having other respiratory viral infections during pregnancy, such as flu, has been associated with problems like low birth weight and preterm birth. Also, having a high fever early in pregnancy may increase the risk of certain birth defects.  Could I transmit coronavirus to my baby during pregnancy or delivery? Among the few case studies of infants born to mothers with COVID-19 published in peer-reviewed literature, none of the infants tested positive for the virus. And there have been no reports of mother-to-baby transmission for other coronaviruses (MERS-CoV and SARS-CoV). Also, there was no virus detected in samples of amniotic fluid or breast  milk. But there have been a few reports of newborns as young as a few days old with infection, suggesting that a mother can transmit the infection to her infant through close contact after delivery.  Is it safe for me to deliver at a hospital where there have been COVID-19 cases? It should be. We know that COVID-19 is a very scary virus. The good news is that hospitals are taking great precautions to keep patients and healthcare providers safe.  According to the CDC guidelines, when a patient is even suspected to have COVID-19, they should be placed in a negative pressure room. (Think of these rooms as vacuums that suck and filter the air so it's safe for the other people in the hospital.) If there are no rooms available, these patients should be asked to wait at home until they can be accomodated safely. This should make it possible for you to deliver at the hospital without putting you or your baby at risk.  Hospitals are also implementing stricter visiting policies to keep patients safe. It's worth calling your hospital to check if there are any new regulations to be aware of.  What plans should I make now in case the hospital system is overwhelmed when it's time for me to deliver? Every hospital is making different plans for dealing with this scenario. Talk with your doctor or midwife once you're at least [redacted] weeks pregnant. I work in Teacher, music.   I work in [employer], [employer]. Should  I ask my doctor to excuse me from work until the baby is born? Should I ask my doctor to excuse me from work until the baby is born? What if I work in a school, the travel industry, or some other high-risk setting? Healthcare facilities should take care to limit the exposure of pregnant employees to patients with confirmed or suspected COVID-19, just as they would with other infectious cases. If you continue working, be sure to follow the CDC's risk assessment and infection control guidelines.  If you work in a school, travel  industry, or other high-risk setting, talk with your employer about what it's doing to protect employees and minimize infection risks. Wash your hands often.   What if my OB gets COVID-19? If your doctor or midwife tests positive for COVID-19, they will need to be quarantined until they recover and are no longer at risk of transmitting the virus. In this case, you'll be assigned to another OB in your doctor's practice (or you may choose another practitioner yourself). Ask your new OB or your doctor's office if you should self-quarantine or be tested for the virus. (It will depend on when you last saw your provider and when that person tested positive.)  Should we hold off on trying to conceive because of COVID-19? At this time, there's no reason to hold off on trying to get pregnant, but the data we have is really limited. For example, we don't think the virus causes birth defects or increases your risk of miscarriage. But we don't know for sure whether you could transmit COVID-19 to your baby before or during delivery. We also don't know if the virus lives in semen or can be sexually transmitted.  We have a babymoon scheduled in the next few months - should we cancel? Yes. At this time, the virus has reached more than 140 countries, and there are travel bans to [place], most of [place], and [place]. Places where large numbers of people gather are at highest risk, especially airports and cruise ships.  If you were planning travel in the [place] note that any travel setting increases your risk of exposure, and there are already many places where everyone is being asked to stay home. To see how the virus is spreading, check The [place] Times map based on CDC data.  For the most current advice to help you avoid exposure, check the CDC's COVID-19 travel page.  Will the hospital separate me from my newborn and keep the baby in quarantine? If you don't have COVID-19 and have not been exposed to the virus, the  hospital will not separate you from your newborn. If you do test positive for COVID-19 or have been exposed but have no symptoms, the CDC, [employer]st[employer], and the Society for Maternal-Fetal Medicine all recommend that you be separated from your baby to decrease the risk of transmission to the baby. This would last until you are no longer at risk of transmitting the virus.  This scenario would, of course, be beyond heartbreaking. Talk to the hospital, your baby's pediatrician, and your family about how to plan for care of your baby in the event that you have to be separated after delivery. And try to make sure you have the emotional support you would need to endure the sadness and stress of having to potentially wait weeks to meet your newborn.   My hospital is restricting visitors and only allowing one support person. If my support person leaves  after the delivery, will they be allowed to come back? Every hospital has different policies. Contact your hospital or labor and delivery unit a week or so before delivery to get the most up-to-date restrictions. In general, if your support person needs to leave, they would be allowed back unless they knew they were exposed to COVID-19 after leaving your company.  My mom was planning to fly here to help me care for my new baby after delivery. Should I tell her not to come? Yes. If your mom is over 60 or has any serious chronic medical conditions (such as heart disease, lung disease, or diabetes), she is at higher risk of serious illness from COVID-19 and should avoid air travel. And remember that any travel setting increases a person's risk of exposure. So, it may be risky to have her around the baby after she has been traveling. For the most current advice on traveling, check the CDC's COVID-19 travel page.  COVID-19 Vaccine Information can be found at:  [employer] For questions related to vaccine distribution or appointments, please email vaccine@ .com or call [phone] [phone].

## 2019-11-23 ENCOUNTER — Ambulatory Visit (INDEPENDENT_AMBULATORY_CARE_PROVIDER_SITE_OTHER): Payer: Medicaid Other | Admitting: Obstetrics and Gynecology

## 2019-11-23 ENCOUNTER — Other Ambulatory Visit: Payer: Self-pay

## 2019-11-23 ENCOUNTER — Encounter: Payer: Self-pay | Admitting: Obstetrics and Gynecology

## 2019-11-23 VITALS — BP 133/88 | HR 109 | Wt 176.0 lb

## 2019-11-23 DIAGNOSIS — Z3A34 34 weeks gestation of pregnancy: Secondary | ICD-10-CM

## 2019-11-23 DIAGNOSIS — Z3480 Encounter for supervision of other normal pregnancy, unspecified trimester: Secondary | ICD-10-CM

## 2019-11-23 DIAGNOSIS — O99013 Anemia complicating pregnancy, third trimester: Secondary | ICD-10-CM | POA: Insufficient documentation

## 2019-11-23 NOTE — Progress Notes (Signed)
   PRENATAL VISIT NOTE  Subjective:  Paige Stevens is a 18 y.o. G1P0 at [redacted]w[redacted]d being seen today for ongoing prenatal care.  She is currently monitored for the following issues for this high-risk pregnancy and has Family history of thyroid cancer; Supervision of other normal pregnancy, antepartum; and Anemia affecting pregnancy in third trimester on their problem list.  Patient reports no complaints.  Contractions: Irritability. Vag. Bleeding: None.  Movement: Present. Denies leaking of fluid.   The following portions of the patient's history were reviewed and updated as appropriate: allergies, current medications, past family history, past medical history, past social history, past surgical history and problem list.   Objective:   Vitals:   11/23/19 0827  BP: 133/88  Pulse: (!) 109  Weight: 176 lb (79.8 kg)    Fetal Status: Fetal Heart Rate (bpm): 132 Fundal Height: 34 cm Movement: Present     General:  Alert, oriented and cooperative. Patient is in no acute distress.  Skin: Skin is warm and dry. No rash noted.   Cardiovascular: Normal heart rate noted  Respiratory: Normal respiratory effort, no problems with respiration noted  Abdomen: Soft, gravid, appropriate for gestational age.  Pain/Pressure: Present     Pelvic: Cervical exam deferred        Extremities: Normal range of motion.  Edema: None  Mental Status: Normal mood and affect. Normal behavior. Normal judgment and thought content.   Assessment and Plan:  Pregnancy: G1P0 at [redacted]w[redacted]d 1. Supervision of other normal pregnancy, antepartum Patient is doing well without complaints Cultures next visit Patient still researching pediatrician  2. Anemia affecting pregnancy in third trimester Continue Ferrous sulfate  Preterm labor symptoms and general obstetric precautions including but not limited to vaginal bleeding, contractions, leaking of fluid and fetal movement were reviewed in detail with the patient. Please  refer to After Visit Summary for other counseling recommendations.   Return in about 2 weeks (around 12/07/2019) for in person, ROB, Low risk.  Future Appointments  Date Time Provider Department Center  12/06/2019  8:00 AM [facility]    Catalina Antigua, MD

## 2019-11-23 NOTE — Progress Notes (Signed)
Pt presents for ROB c/o increased pressure.

## 2019-12-06 ENCOUNTER — Ambulatory Visit: Payer: Medicaid Other | Attending: Obstetrics and Gynecology

## 2019-12-06 ENCOUNTER — Other Ambulatory Visit: Payer: Self-pay

## 2019-12-06 DIAGNOSIS — F329 Major depressive disorder, single episode, unspecified: Secondary | ICD-10-CM | POA: Diagnosis not present

## 2019-12-06 DIAGNOSIS — Z3A36 36 weeks gestation of pregnancy: Secondary | ICD-10-CM

## 2019-12-06 DIAGNOSIS — O99343 Other mental disorders complicating pregnancy, third trimester: Secondary | ICD-10-CM | POA: Diagnosis not present

## 2019-12-06 DIAGNOSIS — F419 Anxiety disorder, unspecified: Secondary | ICD-10-CM

## 2019-12-06 DIAGNOSIS — O358XX0 Maternal care for other (suspected) fetal abnormality and damage, not applicable or unspecified: Secondary | ICD-10-CM | POA: Diagnosis not present

## 2019-12-06 DIAGNOSIS — H0469 Other changes of lacrimal passages: Secondary | ICD-10-CM | POA: Diagnosis not present

## 2019-12-06 DIAGNOSIS — Z363 Encounter for antenatal screening for malformations: Secondary | ICD-10-CM

## 2019-12-07 ENCOUNTER — Other Ambulatory Visit: Payer: Self-pay

## 2019-12-07 ENCOUNTER — Ambulatory Visit (INDEPENDENT_AMBULATORY_CARE_PROVIDER_SITE_OTHER): Payer: Medicaid Other | Admitting: Advanced Practice Midwife

## 2019-12-07 ENCOUNTER — Other Ambulatory Visit (HOSPITAL_COMMUNITY)
Admission: RE | Admit: 2019-12-07 | Discharge: 2019-12-07 | Disposition: A | Discharge status: Home / Self Care | Payer: Medicaid Other | Source: Ambulatory Visit | Attending: Advanced Practice Midwife | Admitting: Advanced Practice Midwife

## 2019-12-07 VITALS — BP 131/95 | HR 90 | Wt 180.0 lb

## 2019-12-07 DIAGNOSIS — Z3480 Encounter for supervision of other normal pregnancy, unspecified trimester: Secondary | ICD-10-CM | POA: Insufficient documentation

## 2019-12-07 DIAGNOSIS — I10 Essential (primary) hypertension: Secondary | ICD-10-CM

## 2019-12-07 NOTE — Addendum Note (Signed)
Addended by: Marya Landry D on: 12/07/2019 09:30 AM   Modules accepted: Orders

## 2019-12-07 NOTE — Progress Notes (Signed)
   PRENATAL VISIT NOTE  Subjective:  Paige Stevens is a 19 y.o. G1P0 at [redacted]w[redacted]d being seen today for ongoing prenatal care.  She is currently monitored for the following issues for this low-risk pregnancy and has Family history of thyroid cancer; Supervision of other normal pregnancy, antepartum; and Anemia affecting pregnancy in third trimester on their problem list.  Patient reports occasional contractions.  Contractions: Irritability. Vag. Bleeding: None.  Movement: Present. Denies leaking of fluid.   The following portions of the patient's history were reviewed and updated as appropriate: allergies, current medications, past family history, past medical history, past social history, past surgical history and problem list.   Objective:   Vitals:   12/07/19 0822 12/07/19 0825  BP: (!) 130/92 (!) 131/95  Pulse: 89 90  Weight: 180 lb (81.6 kg)     Fetal Status: Fetal Heart Rate (bpm): 130 Fundal Height: 35 cm Movement: Present  Presentation: Vertex  General:  Alert, oriented and cooperative. Patient is in no acute distress.  Skin: Skin is warm and dry. No rash noted.   Cardiovascular: Normal heart rate noted  Respiratory: Normal respiratory effort, no problems with respiration noted  Abdomen: Soft, gravid, appropriate for gestational age.  Pain/Pressure: Present     Pelvic: Cervical exam performed in the presence of a chaperone Dilation: Fingertip Effacement (%): 50 Station: -3  Extremities: Normal range of motion.  Edema: Trace  Mental Status: Normal mood and affect. Normal behavior. Normal judgment and thought content.   Assessment and Plan:  Pregnancy: G1P0 at [redacted]w[redacted]d 1. Supervision of other normal pregnancy, antepartum --Anticipatory guidance about next visits/weeks of pregnancy given. --Next visit in 1 week (plus BP check tomorrow, see below) --Reviewed labor readiness with patient including the [employer], evening primrose oil, and raspberry leaf tea.   2.  Elevated blood pressure reading in office with diagnosis of hypertension --No hx HTN before or during pregnancy. --No s/sx of PEC  --Labs in office today and BP check tomorrow --Discussed plan for IOL at 37 weeks if GHTN dx.  Pt given opportunity to ask questions and states understanding. - CBC - Comp Met (CMET) - Protein / creatinine ratio, urine  Term labor symptoms and general obstetric precautions including but not limited to vaginal bleeding, contractions, leaking of fluid and fetal movement were reviewed in detail with the patient. Please refer to After Visit Summary for other counseling recommendations.   Return in about 1 week (around 12/14/2019).  No future appointments.  Fatima Blank, CNM

## 2019-12-07 NOTE — Patient Instructions (Signed)
Things to Try After 37 weeks to Encourage Labor/Get Ready for Labor:   1.  Try the [employer] Circuit at www.milescircuit.com daily to improve baby's position and encourage the onset of labor.  2. Walk a little and rest a little every day.  Change positions often.  3. Cervical Ripening: May try one or both a. Red Raspberry Leaf capsules or tea:  two 300mg or 400mg tablets with each meal, 2-3 times a day, or 1-3 cups of tea daily  Potential Side Effects Of Raspberry Leaf:  Most women do not experience any side effects from drinking raspberry leaf tea. However, nausea and loose stools are possible   b. Evening Primrose Oil capsules: may take 1 to 3 capsules daily. Take 1-2 capsules by mouth each day and place one capsule vaginally at night.  You may also prick the vaginal capsule to release the oil prior to inserting in the vagina. Some of the potential side effects:  Upset stomach  Loose stools or diarrhea  Headaches  Nausea  4. Sex (and especially sex with orgasm) can also help the cervix ripen and encourage labor onset.  Labor Precautions Reasons to come to MAU at [facility]:  1.  Contractions are  5 minutes apart or less, each last 1 minute, these have been going on for 1-2 hours, and you cannot walk or talk during them 2.  You have a large gush of fluid, or a trickle of fluid that will not stop and you have to wear a pad 3.  You have bleeding that is bright red, heavier than spotting--like menstrual bleeding (spotting can be normal in early labor or after a check of your cervix) 4.  You do not feel the baby moving like he/she normally does 

## 2019-12-08 ENCOUNTER — Ambulatory Visit: Payer: Medicaid Other

## 2019-12-08 VITALS — BP 137/88 | HR 105

## 2019-12-08 DIAGNOSIS — Z0130 Encounter for examination of blood pressure without abnormal findings: Secondary | ICD-10-CM

## 2019-12-08 LAB — COMPREHENSIVE METABOLIC PANEL
ALT: 18 IU/L (ref 0–32)
AST: 27 IU/L (ref 0–40)
Albumin/Globulin Ratio: 1.2 (ref 1.2–2.2)
Albumin: 3.7 g/dL — ABNORMAL LOW (ref 3.9–5.0)
Alkaline Phosphatase: 263 IU/L — ABNORMAL HIGH (ref 42–106)
BUN/Creatinine Ratio: 7 — ABNORMAL LOW (ref 9–23)
BUN: 5 mg/dL — ABNORMAL LOW (ref 6–20)
Bilirubin Total: 0.2 mg/dL (ref 0.0–1.2)
CO2: 19 mmol/L — ABNORMAL LOW (ref 20–29)
Calcium: 9.5 mg/dL (ref 8.7–10.2)
Chloride: 101 mmol/L (ref 96–106)
Creatinine, Ser: 0.67 mg/dL (ref 0.57–1.00)
GFR calc Af Amer: 146 mL/min/{1.73_m2} (ref >59)
GFR calc non Af Amer: 127 mL/min/{1.73_m2} (ref >59)
Globulin, Total: 3 g/dL (ref 1.5–4.5)
Glucose: 85 mg/dL (ref 65–99)
Potassium: 4.5 mmol/L (ref 3.5–5.2)
Sodium: 134 mmol/L (ref 134–144)
Total Protein: 6.7 g/dL (ref 6.0–8.5)

## 2019-12-08 LAB — CERVICOVAGINAL ANCILLARY ONLY
Chlamydia: NEGATIVE
Comment: NEGATIVE
Comment: NORMAL
Neisseria Gonorrhea: NEGATIVE

## 2019-12-08 LAB — CBC
Hematocrit: 32.2 % — ABNORMAL LOW (ref 34.0–46.6)
Hemoglobin: 11.1 g/dL (ref 11.1–15.9)
MCH: 30.7 pg (ref 26.6–33.0)
MCHC: 34.5 g/dL (ref 31.5–35.7)
MCV: 89 fL (ref 79–97)
Platelets: 219 10*3/uL (ref 150–450)
RBC: 3.62 x10E6/uL — ABNORMAL LOW (ref 3.77–5.28)
RDW: 15.5 % — ABNORMAL HIGH (ref 11.7–15.4)
WBC: 8.1 10*3/uL (ref 3.4–10.8)

## 2019-12-08 LAB — PROTEIN / CREATININE RATIO, URINE
Creatinine, Urine: 22.3 mg/dL
Protein, Ur: <4 mg/dL

## 2019-12-08 NOTE — Progress Notes (Signed)
..  Subjective:  Paige Stevens is a 19 y.o. female here for BP check.   Hypertension ROS: The patient is currently not taking any medications for BP and denies abnormal symptoms.   Objective:  BP 137/88   Pulse (!) 105   LMP 03/27/2019   Appearance alert, well appearing, and in no distress. General exam BP noted to be well controlled today in office.    Assessment:   Blood Pressure needs further observation.   Plan:  Orders and follow up as documented in patient record.. Pt had labs done yesterday, waiting for results, pt will return to office on 12-10-19 to re-check BP, go over results, and determine next steps. Advised to notify us if symptoms change, pt agreed.

## 2019-12-09 LAB — STREP GP B NAA: Strep Gp B NAA: NEGATIVE

## 2019-12-10 ENCOUNTER — Other Ambulatory Visit: Payer: Self-pay

## 2019-12-10 ENCOUNTER — Ambulatory Visit (INDEPENDENT_AMBULATORY_CARE_PROVIDER_SITE_OTHER): Payer: Medicaid Other

## 2019-12-10 DIAGNOSIS — O99013 Anemia complicating pregnancy, third trimester: Secondary | ICD-10-CM | POA: Diagnosis not present

## 2019-12-10 NOTE — Progress Notes (Signed)
Pt is here for BP check, [redacted]w[redacted]d. No medications prescribed for BP.   Pt reports headache last night, has since gone away. Trace swelling in hands and feet. Pt reports good fetal movement, no contractions.  BP well controlled today without medication. Advised pt to keep OB appt for next week. Pre-eclampsia symptoms reviewed with patient and when to seek evaluation at the hospital, pt voices understanding.

## 2019-12-11 NOTE — Progress Notes (Signed)
Patient was assessed and managed by nursing staff during this encounter. I have reviewed the chart and agree with the documentation and plan. I have also made any necessary editorial changes.  Catalina Antigua, MD 12/11/2019 12:11 PM

## 2019-12-12 ENCOUNTER — Inpatient Hospital Stay (HOSPITAL_COMMUNITY)
Admission: AD | Admit: 2019-12-12 | Discharge: 2019-12-12 | Disposition: A | Discharge status: Home / Self Care | Payer: Medicaid Other | Attending: Obstetrics & Gynecology | Admitting: Obstetrics & Gynecology

## 2019-12-12 ENCOUNTER — Encounter (HOSPITAL_COMMUNITY): Payer: Self-pay | Admitting: Obstetrics & Gynecology

## 2019-12-12 ENCOUNTER — Other Ambulatory Visit: Payer: Self-pay

## 2019-12-12 DIAGNOSIS — B373 Candidiasis of vulva and vagina: Secondary | ICD-10-CM | POA: Diagnosis not present

## 2019-12-12 DIAGNOSIS — Z3A37 37 weeks gestation of pregnancy: Secondary | ICD-10-CM | POA: Diagnosis not present

## 2019-12-12 DIAGNOSIS — O98813 Other maternal infectious and parasitic diseases complicating pregnancy, third trimester: Secondary | ICD-10-CM | POA: Diagnosis not present

## 2019-12-12 DIAGNOSIS — Z3689 Encounter for other specified antenatal screening: Secondary | ICD-10-CM

## 2019-12-12 DIAGNOSIS — Z0371 Encounter for suspected problem with amniotic cavity and membrane ruled out: Secondary | ICD-10-CM | POA: Diagnosis not present

## 2019-12-12 DIAGNOSIS — B3731 Acute candidiasis of vulva and vagina: Secondary | ICD-10-CM

## 2019-12-12 HISTORY — DX: Anemia, unspecified: D64.9

## 2019-12-12 LAB — WET PREP, GENITAL
Sperm: NONE SEEN
Trich, Wet Prep: NONE SEEN

## 2019-12-12 MED ORDER — FLUCONAZOLE 150 MG PO TABS: 150.0000 mg | ORAL_TABLET | Freq: Every day | ORAL | 0 refills | Status: DC

## 2019-12-12 NOTE — MAU Provider Note (Signed)
First Provider Initiated Contact with Patient 12/12/19 2054     S: [name] is a 19 y.o. G1P0 at [redacted]w[redacted]d  who presents to MAU today complaining of leaking of fluid since around 1330 this afternoon. She reports that she felt some trickling after she went to the restroom, places a panty liner and had occasional leaking twice since 1330. Recent IC last night.  She denies vaginal bleeding. She denies contractions. She reports normal fetal movement.    O: BP 135/72 (BP Location: Right Arm)   Pulse (!) 109   Temp 98.8 F (37.1 C) (Oral)   Resp 17   Ht 5\' 3"  (1.6 m)   Wt 83 kg   LMP 03/27/2019   SpO2 99%   BMI 32.42 kg/m  GENERAL: Well-developed, well-nourished female in no acute distress.  HEAD: Normocephalic, atraumatic.  CHEST: Normal effort of breathing, regular heart rate ABDOMEN: Soft, nontender, gravid PELVIC: Normal external female genitalia. Vagina is pink and rugated. Cervix with normal contour, no lesions. Small amount of white thick discharge- small patches of discharge adherent to cervix.  Negative pooling.   Cervical exam:  Dilation: Fingertip Exam by:: [email or web address] CNM   Fetal Monitoring: Baseline: 140 Variability: moderate Accelerations: present  Decelerations: none  Contractions: UI  Results for orders placed or performed during the hospital encounter of 12/12/19 (from the past 24 hour(s))  Wet prep, genital     Status: Abnormal   Collection Time: 12/12/19  9:12 PM  Result Value Ref Range   Yeast Wet Prep HPF POC PRESENT (A) NONE SEEN   Trich, Wet Prep NONE SEEN NONE SEEN   Clue Cells Wet Prep HPF POC PRESENT (A) NONE SEEN   WBC, Wet Prep HPF POC FEW (A) NONE SEEN   Sperm NONE SEEN    Fern x2 negative   A: SIUP at [redacted]w[redacted]d  Membranes intact  1. No leakage of amniotic fluid into vagina   2. [redacted] weeks gestation of pregnancy   3. NST (non-stress test) reactive   4. Vulvovaginal candidiasis     P: Discharge home Follow up as scheduled in the  office for prenatal care Rx for diflucan x1 ordered Labor precautions and reasons to present to MAU discussed   [redacted]w[redacted]d, CNM 12/12/2019 9:46 PM

## 2019-12-12 NOTE — MAU Note (Signed)
37.1 week G1P0 reports leaking clear fluid at 1300 today-small amount of leaking since-unsure if its her membranes. + intercourse in last 24 hours. Denies vaginal bleeding or bloody show. Endorses + fetal movement.

## 2019-12-12 NOTE — Discharge Instructions (Signed)

## 2019-12-15 ENCOUNTER — Encounter: Payer: Self-pay | Admitting: Obstetrics and Gynecology

## 2019-12-15 ENCOUNTER — Other Ambulatory Visit: Payer: Self-pay | Admitting: Advanced Practice Midwife

## 2019-12-15 ENCOUNTER — Other Ambulatory Visit: Payer: Self-pay

## 2019-12-15 ENCOUNTER — Ambulatory Visit (INDEPENDENT_AMBULATORY_CARE_PROVIDER_SITE_OTHER): Payer: Medicaid Other | Admitting: Obstetrics and Gynecology

## 2019-12-15 VITALS — BP 127/87 | HR 110 | Wt 181.8 lb

## 2019-12-15 DIAGNOSIS — Z3A37 37 weeks gestation of pregnancy: Secondary | ICD-10-CM | POA: Diagnosis not present

## 2019-12-15 DIAGNOSIS — Z3480 Encounter for supervision of other normal pregnancy, unspecified trimester: Secondary | ICD-10-CM

## 2019-12-15 DIAGNOSIS — O133 Gestational [pregnancy-induced] hypertension without significant proteinuria, third trimester: Secondary | ICD-10-CM | POA: Insufficient documentation

## 2019-12-15 NOTE — Progress Notes (Signed)
   Procedure: Patient informed of R/B/A of procedure. NST was performed and was reactive prior to procedure. NST:  EFM: Baseline: 130 bpm, Variability: Good {> 6 bpm), Accelerations: Reactive and Decelerations: Absent Toco: none Procedure done to begin ripening of the cervix prior to admission for induction of labor. Appropriate time out taken. The patient was placed in the lithotomy position and the cervix brought into view with sterile speculum. A ring forcep was used to guide the 56F foley through the internal os of the cervix. Foley Balloon filled with 40cc of sterile water. Patient could not tolerate any further sterile water.  Plug inserted into end of the foley. Foley placed on tension and taped to medial thigh.  NST:  EFM Baseline: 140 bpm, Variability: Good {> 6 bpm), Accelerations: Reactive and Decelerations: Absent  Toco: none There were no signs of tachysystole or hypertonus. All equipment was removed and accounted for. The patient tolerated the procedure well.   Duane Lope, NP 12/15/2019 4:32 PM

## 2019-12-15 NOTE — Progress Notes (Signed)
Pt presents for ROB c/o intermittent HA's Has not started BV or yeast meds yet

## 2019-12-15 NOTE — Progress Notes (Signed)
° °  PRENATAL VISIT NOTE  Subjective:  Paige Stevens is a 19 y.o. G1P0 at [redacted]w[redacted]d being seen today for ongoing prenatal care.  She is currently monitored for the following issues for this low-risk pregnancy and has Family history of thyroid cancer; Supervision of other normal pregnancy, antepartum; Anemia affecting pregnancy in third trimester; Gestational hypertension, third trimester; and [redacted] weeks gestation of pregnancy on their problem list.  Patient reports no complaints.  Contractions: Irregular. Vag. Bleeding: None.  Movement: Present. Denies leaking of fluid.   The following portions of the patient's history were reviewed and updated as appropriate: allergies, current medications, past family history, past medical history, past social history, past surgical history and problem list.   Objective:   Vitals:   12/15/19 1517 12/15/19 1526  BP: (!) 133/92 127/87  Pulse: (!) 110   Weight: 181 lb 12.8 oz (82.5 kg)     Fetal Status: Fetal Heart Rate (bpm): 133   Movement: Present     General:  Alert, oriented and cooperative. Patient is in no acute distress.  Skin: Skin is warm and dry. No rash noted.   Cardiovascular: Normal heart rate noted  Respiratory: Normal respiratory effort, no problems with respiration noted  Abdomen: Soft, gravid, appropriate for gestational age.  Pain/Pressure: Present     Pelvic: Cervical exam performed in the presence of a chaperone        Extremities: Normal range of motion.  Edema: Trace  Mental Status: Normal mood and affect. Normal behavior. Normal judgment and thought content.   Assessment and Plan:   1. [redacted] weeks gestation of pregnancy   2. Supervision of other normal pregnancy, antepartum   3. Gestational hypertension, third trimester  BP elevated on 10/5 and today.  intermittent HA's x 2 week relieved with tylenol.  No HA now, no scotoma.  Foley bulb placement in office today with induction@  Midnight tonight 10/14- orders placed.    Induction signed.   Term labor symptoms and general obstetric precautions including but not limited to vaginal bleeding, contractions, leaking of fluid and fetal movement were reviewed in detail with the patient. Please refer to After Visit Summary for other counseling recommendations.   No follow-ups on file.  Future Appointments  Date Time Provider Department Center  12/16/2019 12:00 AM [facility] SCHED ROOM [facility] None    Venia Carbon, NP

## 2019-12-16 ENCOUNTER — Other Ambulatory Visit: Payer: Self-pay

## 2019-12-16 ENCOUNTER — Inpatient Hospital Stay (HOSPITAL_COMMUNITY)
Admission: AD | Admit: 2019-12-16 | Payer: Medicaid Other | Source: Home / Self Care | Admitting: Obstetrics & Gynecology

## 2019-12-16 ENCOUNTER — Encounter (HOSPITAL_COMMUNITY): Payer: Self-pay | Admitting: Obstetrics & Gynecology

## 2019-12-16 ENCOUNTER — Inpatient Hospital Stay (HOSPITAL_COMMUNITY)
Admission: AD | Admit: 2019-12-16 | Discharge: 2019-12-19 | DRG: 806 | Disposition: A | Discharge status: Home / Self Care | Payer: Medicaid Other | Attending: Obstetrics & Gynecology | Admitting: Obstetrics & Gynecology

## 2019-12-16 ENCOUNTER — Inpatient Hospital Stay (HOSPITAL_COMMUNITY): Payer: Medicaid Other | Attending: Obstetrics & Gynecology

## 2019-12-16 DIAGNOSIS — Z8616 Personal history of COVID-19: Secondary | ICD-10-CM | POA: Diagnosis not present

## 2019-12-16 DIAGNOSIS — O99013 Anemia complicating pregnancy, third trimester: Secondary | ICD-10-CM | POA: Diagnosis present

## 2019-12-16 DIAGNOSIS — Z3A37 37 weeks gestation of pregnancy: Secondary | ICD-10-CM | POA: Diagnosis not present

## 2019-12-16 DIAGNOSIS — O9081 Anemia of the puerperium: Secondary | ICD-10-CM | POA: Diagnosis not present

## 2019-12-16 DIAGNOSIS — Z3490 Encounter for supervision of normal pregnancy, unspecified, unspecified trimester: Secondary | ICD-10-CM | POA: Diagnosis present

## 2019-12-16 DIAGNOSIS — O134 Gestational [pregnancy-induced] hypertension without significant proteinuria, complicating childbirth: Secondary | ICD-10-CM | POA: Diagnosis not present

## 2019-12-16 DIAGNOSIS — Z20822 Contact with and (suspected) exposure to covid-19: Secondary | ICD-10-CM | POA: Diagnosis present

## 2019-12-16 DIAGNOSIS — O133 Gestational [pregnancy-induced] hypertension without significant proteinuria, third trimester: Secondary | ICD-10-CM | POA: Diagnosis present

## 2019-12-16 DIAGNOSIS — D62 Acute posthemorrhagic anemia: Secondary | ICD-10-CM | POA: Diagnosis not present

## 2019-12-16 DIAGNOSIS — Z3480 Encounter for supervision of other normal pregnancy, unspecified trimester: Secondary | ICD-10-CM

## 2019-12-16 HISTORY — DX: Essential (primary) hypertension: I10

## 2019-12-16 LAB — CBC
HCT: 32.4 % — ABNORMAL LOW (ref 36.0–46.0)
Hemoglobin: 10.8 g/dL — ABNORMAL LOW (ref 12.0–15.0)
MCH: 30.3 pg (ref 26.0–34.0)
MCHC: 33.3 g/dL (ref 30.0–36.0)
MCV: 90.8 fL (ref 80.0–100.0)
Platelets: 237 10*3/uL (ref 150–400)
RBC: 3.57 MIL/uL — ABNORMAL LOW (ref 3.87–5.11)
RDW: 16.7 % — ABNORMAL HIGH (ref 11.5–15.5)
WBC: 9 10*3/uL (ref 4.0–10.5)
nRBC: 0.2 % (ref 0.0–0.2)

## 2019-12-16 LAB — COMPREHENSIVE METABOLIC PANEL
ALT: 21 U/L (ref 0–44)
AST: 31 U/L (ref 15–41)
Albumin: 2.9 g/dL — ABNORMAL LOW (ref 3.5–5.0)
Alkaline Phosphatase: 236 U/L — ABNORMAL HIGH (ref 38–126)
Anion gap: 10 (ref 5–15)
BUN: 9 mg/dL (ref 6–20)
CO2: 19 mmol/L — ABNORMAL LOW (ref 22–32)
Calcium: 9.1 mg/dL (ref 8.9–10.3)
Chloride: 106 mmol/L (ref 98–111)
Creatinine, Ser: 0.64 mg/dL (ref 0.44–1.00)
GFR, Estimated: >60 mL/min (ref >60)
Glucose, Bld: 101 mg/dL — ABNORMAL HIGH (ref 70–99)
Potassium: 4.1 mmol/L (ref 3.5–5.1)
Sodium: 135 mmol/L (ref 135–145)
Total Bilirubin: 0.6 mg/dL (ref 0.3–1.2)
Total Protein: 6.7 g/dL (ref 6.5–8.1)

## 2019-12-16 LAB — RESPIRATORY PANEL BY RT PCR (FLU A&B, COVID)
Influenza A by PCR: NEGATIVE
Influenza B by PCR: NEGATIVE
SARS Coronavirus 2 by RT PCR: NEGATIVE

## 2019-12-16 LAB — PROTEIN / CREATININE RATIO, URINE
Creatinine, Urine: 75.3 mg/dL
Protein Creatinine Ratio: 0.17 mg/mg{Cre} — ABNORMAL HIGH (ref 0.00–0.15)
Total Protein, Urine: 13 mg/dL

## 2019-12-16 LAB — RPR: RPR Ser Ql: NONREACTIVE

## 2019-12-16 MED ORDER — FENTANYL CITRATE (PF) 100 MCG/2ML IJ SOLN: 50.0000 ug | INTRAMUSCULAR | Status: DC | PRN

## 2019-12-16 MED ORDER — MISOPROSTOL 50MCG HALF TABLET: 50.0000 ug | ORAL_TABLET | ORAL | Status: DC | PRN

## 2019-12-16 MED ORDER — OXYTOCIN-SODIUM CHLORIDE 30-0.9 UT/500ML-% IV SOLN: 1.0000 m[IU]/min | INTRAVENOUS | Status: DC

## 2019-12-16 MED ORDER — OXYCODONE-ACETAMINOPHEN 5-325 MG PO TABS: 1.0000 | ORAL_TABLET | ORAL | Status: DC | PRN

## 2019-12-16 MED ORDER — LIDOCAINE HCL (PF) 1 % IJ SOLN
30.0000 mL | INTRAMUSCULAR | Status: AC | PRN
  Administered 2019-12-17: 30 mL via SUBCUTANEOUS
  Filled 2019-12-16: qty 30

## 2019-12-16 MED ORDER — ACETAMINOPHEN 325 MG PO TABS: 650.0000 mg | ORAL_TABLET | ORAL | Status: DC | PRN

## 2019-12-16 MED ORDER — ONDANSETRON HCL 4 MG/2ML IJ SOLN: 4.0000 mg | Freq: Four times a day (QID) | INTRAMUSCULAR | Status: DC | PRN

## 2019-12-16 MED ORDER — FENTANYL CITRATE (PF) 100 MCG/2ML IJ SOLN
100.0000 ug | INTRAMUSCULAR | Status: DC | PRN
  Administered 2019-12-17: 100 ug via INTRAVENOUS
  Filled 2019-12-16: qty 2

## 2019-12-16 MED ORDER — FENTANYL CITRATE (PF) 100 MCG/2ML IJ SOLN
INTRAMUSCULAR | Status: AC
  Administered 2019-12-16: 100 ug via INTRAVENOUS
  Filled 2019-12-16: qty 2

## 2019-12-16 MED ORDER — OXYCODONE-ACETAMINOPHEN 5-325 MG PO TABS: 2.0000 | ORAL_TABLET | ORAL | Status: DC | PRN

## 2019-12-16 MED ORDER — LACTATED RINGERS IV SOLN: INTRAVENOUS | Status: DC

## 2019-12-16 MED ORDER — OXYTOCIN-SODIUM CHLORIDE 30-0.9 UT/500ML-% IV SOLN: 2.5000 [IU]/h | INTRAVENOUS | Status: DC

## 2019-12-16 MED ORDER — TERBUTALINE SULFATE 1 MG/ML IJ SOLN: 0.2500 mg | Freq: Once | INTRAMUSCULAR | Status: DC | PRN

## 2019-12-16 MED ORDER — MISOPROSTOL 50MCG HALF TABLET
ORAL_TABLET | ORAL | Status: AC
  Administered 2019-12-16: 50 ug via BUCCAL
  Filled 2019-12-16: qty 1

## 2019-12-16 MED ORDER — OXYTOCIN-SODIUM CHLORIDE 30-0.9 UT/500ML-% IV SOLN
1.0000 m[IU]/min | INTRAVENOUS | Status: DC
  Administered 2019-12-16: 2 m[IU]/min via INTRAVENOUS
  Filled 2019-12-16: qty 500

## 2019-12-16 MED ORDER — LACTATED RINGERS IV SOLN: 500.0000 mL | INTRAVENOUS | Status: DC | PRN

## 2019-12-16 MED ORDER — SOD CITRATE-CITRIC ACID 500-334 MG/5ML PO SOLN: 30.0000 mL | ORAL | Status: DC | PRN

## 2019-12-16 MED ORDER — OXYTOCIN BOLUS FROM INFUSION
333.0000 mL | Freq: Once | INTRAVENOUS | Status: AC
  Administered 2019-12-17: 333 mL via INTRAVENOUS

## 2019-12-16 NOTE — MAU Note (Signed)
Called BS and pt given RM # but will start iv first. EFM applied and will proceed with admission IV, labs,etc.

## 2019-12-16 NOTE — Progress Notes (Addendum)
Labor Progress Note Paige Stevens is a 19 y.o. G1P0 at [redacted]w[redacted]d presented for IOL-gHTN. S: Doing well without complaints.  O:  BP (!) 145/94   Pulse 94   Temp 98.5 F (36.9 C) (Oral)   Resp 18   Ht 5\' 3"  (1.6 m)   Wt 82.1 kg   LMP 03/27/2019   BMI 32.06 kg/m  EFM: baseline 130bpm/mod variability/+accels/no decels Toco: 1-3 minutes  CVE: Dilation: 4.5 Effacement (%): 70 Station: -1 Presentation: Vertex Exam by:: Dr. [email or web address]   A&P: 19 y.o. G1P0 [redacted]w[redacted]d presented for IOL-gHTN. #IOL: S/p outpatient FB and cytotec x1 (0500). S/p SROM 10/13 @2200 . Progressing well and having regular, painful contractions. If cervical does not continue to dilate, will start pit. #Pain: PRN  #FWB: cat 1 #GBS negative #gHTN: preE labs normal, asymptomatic. BP elevated, but not in severe range. Will continue to monitor.  11/13, MD PGY1 2:19 PM

## 2019-12-16 NOTE — MAU Note (Signed)
Dr Myriam Jacobson called Artelia Laroche CNM and pt may come straight up to BS since pt was originally for IOL at [facility] but had to be delayed to to [facility] acuity. Pt has foley bulb in place. Covid swab obtained without difficulty and pt tol well. No symptoms.

## 2019-12-16 NOTE — Progress Notes (Signed)
Labor Progress Note Mickey Hebel is a 19 y.o. G1P0 at [redacted]w[redacted]d presented for IOL-gHTN. S: Doing well without complaints.  O:  BP 134/89   Pulse (!) 107   Temp 99 F (37.2 C) (Oral)   Resp 16   Ht 5\' 3"  (1.6 m)   Wt 82.1 kg   LMP 03/27/2019   BMI 32.06 kg/m  EFM: baseline 130bpm/mod variability/+accels/no decels Toco: 1-3 minutes  CVE: Dilation: Fingertip (external oss 3.5 cm) Effacement (%): 50 Station: -2 Presentation: Vertex Exam by:: Dr. [email or web address], RN   A&P: 19 y.o. G1P0 [redacted]w[redacted]d presented for IOL-gHTN. #IOL: S/p outpatient FB and cytotec x1. S/p SROM 10/13 @2200 . Will re-dose cytotec at next check and re-attempt FB placement at next check given cervical exam. #Pain: PRN  #FWB: cat 1 #GBS negative #gHTN: preE labs normal, asymptomatic. BP currently well controlled. Will continue to monitor.  11/13, MD 8:52 AM
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
## 2019-12-16 NOTE — Progress Notes (Signed)
Labor Progress Note Paige Stevens is a 19 y.o. G1P0 at [redacted]w[redacted]d presented for IOL-gHTN.  S: Doing well without complaints. Reports contractions feel stronger.  O:  BP 112/70   Pulse 91   Temp 99.2 F (37.3 C) (Oral)   Resp 18   Ht 5\' 3"  (1.6 m)   Wt 82.1 kg   LMP 03/27/2019   BMI 32.06 kg/m  EFM: baseline 130bpm/mod variability/+accels/no decels Toco: 2-5 minutes  CVE: Dilation: 4.5 Effacement (%): 70 Station: -1 Presentation: Vertex Exam by:: Dr. [email or web address] (Confirmed by Paige Lamas, RN)   A&P: 19 y.o. G1P0 101w5d presented for IOL-gHTN. #IOL: S/p outpatient FB and cytotec x1 (0500). S/p SROM 10/13 @2200 . Given no cervical change on last exam, started pitocin. #Pain: PRN  #FWB: cat 1 #GBS negative #gHTN: preE labs normal, asymptomatic. Pressures are normal. Will continue to monitor.  11/13, MD PGY1 7:20 PM

## 2019-12-17 ENCOUNTER — Inpatient Hospital Stay (HOSPITAL_COMMUNITY): Payer: Medicaid Other | Admitting: Anesthesiology

## 2019-12-17 ENCOUNTER — Encounter (HOSPITAL_COMMUNITY): Payer: Self-pay | Admitting: Obstetrics & Gynecology

## 2019-12-17 DIAGNOSIS — Z3A37 37 weeks gestation of pregnancy: Secondary | ICD-10-CM

## 2019-12-17 DIAGNOSIS — O134 Gestational [pregnancy-induced] hypertension without significant proteinuria, complicating childbirth: Secondary | ICD-10-CM

## 2019-12-17 LAB — CBC
HCT: 32.2 % — ABNORMAL LOW (ref 36.0–46.0)
HCT: 20.3 % — ABNORMAL LOW (ref 36.0–46.0)
Hemoglobin: 11 g/dL — ABNORMAL LOW (ref 12.0–15.0)
Hemoglobin: 6.6 g/dL — CL (ref 12.0–15.0)
MCH: 30.3 pg (ref 26.0–34.0)
MCH: 29.7 pg (ref 26.0–34.0)
MCHC: 34.2 g/dL (ref 30.0–36.0)
MCHC: 32.5 g/dL (ref 30.0–36.0)
MCV: 88.7 fL (ref 80.0–100.0)
MCV: 91.4 fL (ref 80.0–100.0)
Platelets: 241 10*3/uL (ref 150–400)
Platelets: 182 10*3/uL (ref 150–400)
RBC: 3.63 MIL/uL — ABNORMAL LOW (ref 3.87–5.11)
RBC: 2.22 MIL/uL — ABNORMAL LOW (ref 3.87–5.11)
RDW: 17.1 % — ABNORMAL HIGH (ref 11.5–15.5)
RDW: 17.4 % — ABNORMAL HIGH (ref 11.5–15.5)
WBC: 15.5 10*3/uL — ABNORMAL HIGH (ref 4.0–10.5)
WBC: 16.4 10*3/uL — ABNORMAL HIGH (ref 4.0–10.5)
nRBC: 0 % (ref 0.0–0.2)
nRBC: 0 % (ref 0.0–0.2)

## 2019-12-17 LAB — DIC (DISSEMINATED INTRAVASCULAR COAGULATION)PANEL
D-Dimer, Quant: 2.38 ug/mL-FEU — ABNORMAL HIGH (ref 0.00–0.50)
Fibrinogen: 335 mg/dL (ref 210–475)
INR: 1.1 (ref 0.8–1.2)
Platelets: 192 10*3/uL (ref 150–400)
Prothrombin Time: 13.6 seconds (ref 11.4–15.2)
Smear Review: NONE SEEN
aPTT: 30 seconds (ref 24–36)

## 2019-12-17 LAB — PREPARE RBC (CROSSMATCH)

## 2019-12-17 LAB — POSTPARTUM HEMORRHAGE PROTOCOL (BB NOTIFICATION)

## 2019-12-17 MED ORDER — MISOPROSTOL 200 MCG PO TABS
ORAL_TABLET | ORAL | Status: AC
  Filled 2019-12-17: qty 5

## 2019-12-17 MED ORDER — ONDANSETRON HCL 4 MG PO TABS: 4.0000 mg | ORAL_TABLET | ORAL | Status: DC | PRN

## 2019-12-17 MED ORDER — FENTANYL CITRATE (PF) 100 MCG/2ML IJ SOLN
INTRAMUSCULAR | Status: AC
  Administered 2019-12-17: 50 ug via INTRAVENOUS
  Filled 2019-12-17: qty 4

## 2019-12-17 MED ORDER — LACTATED RINGERS IV SOLN: INTRAVENOUS | Status: DC

## 2019-12-17 MED ORDER — SENNOSIDES-DOCUSATE SODIUM 8.6-50 MG PO TABS
1.0000 | ORAL_TABLET | Freq: Two times a day (BID) | ORAL | Status: DC
  Administered 2019-12-17 – 2019-12-19 (×5): 1 via ORAL
  Filled 2019-12-17 (×5): qty 1

## 2019-12-17 MED ORDER — SODIUM CHLORIDE 0.9% FLUSH: 3.0000 mL | INTRAVENOUS | Status: DC | PRN

## 2019-12-17 MED ORDER — PHENYLEPHRINE 40 MCG/ML (10ML) SYRINGE FOR IV PUSH (FOR BLOOD PRESSURE SUPPORT): 80.0000 ug | PREFILLED_SYRINGE | INTRAVENOUS | Status: DC | PRN

## 2019-12-17 MED ORDER — MEASLES, MUMPS & RUBELLA VAC IJ SOLR: 0.5000 mL | Freq: Once | INTRAMUSCULAR | Status: DC

## 2019-12-17 MED ORDER — CARBOPROST TROMETHAMINE 250 MCG/ML IM SOLN: 250.0000 ug | INTRAMUSCULAR | Status: DC | PRN

## 2019-12-17 MED ORDER — LACTATED RINGERS IV BOLUS
1000.0000 mL | Freq: Once | INTRAVENOUS | Status: AC
  Administered 2019-12-17: 1000 mL via INTRAVENOUS

## 2019-12-17 MED ORDER — LACTATED RINGERS IV SOLN
500.0000 mL | Freq: Once | INTRAVENOUS | Status: AC
  Administered 2019-12-17: 500 mL via INTRAVENOUS

## 2019-12-17 MED ORDER — WITCH HAZEL-GLYCERIN EX PADS: 1.0000 "application " | MEDICATED_PAD | CUTANEOUS | Status: DC | PRN

## 2019-12-17 MED ORDER — IBUPROFEN 600 MG PO TABS
600.0000 mg | ORAL_TABLET | Freq: Four times a day (QID) | ORAL | Status: DC
  Administered 2019-12-17 – 2019-12-19 (×10): 600 mg via ORAL
  Filled 2019-12-17 (×11): qty 1

## 2019-12-17 MED ORDER — SODIUM CHLORIDE 0.9% IV SOLUTION: Freq: Once | INTRAVENOUS | Status: DC

## 2019-12-17 MED ORDER — SODIUM CHLORIDE 0.9% FLUSH: 3.0000 mL | Freq: Two times a day (BID) | INTRAVENOUS | Status: DC

## 2019-12-17 MED ORDER — OXYTOCIN-SODIUM CHLORIDE 30-0.9 UT/500ML-% IV SOLN
INTRAVENOUS | Status: AC
  Administered 2019-12-17: 10 [IU]/h via INTRAVENOUS
  Filled 2019-12-17: qty 500

## 2019-12-17 MED ORDER — DIBUCAINE (PERIANAL) 1 % EX OINT: 1.0000 "application " | TOPICAL_OINTMENT | CUTANEOUS | Status: DC | PRN

## 2019-12-17 MED ORDER — LIDOCAINE HCL (PF) 1 % IJ SOLN
INTRAMUSCULAR | Status: DC | PRN
  Administered 2019-12-17: 5 mL via EPIDURAL

## 2019-12-17 MED ORDER — DIPHENHYDRAMINE HCL 25 MG PO CAPS: 25.0000 mg | ORAL_CAPSULE | Freq: Four times a day (QID) | ORAL | Status: DC | PRN

## 2019-12-17 MED ORDER — DIPHENHYDRAMINE HCL 50 MG/ML IJ SOLN: 12.5000 mg | INTRAMUSCULAR | Status: DC | PRN

## 2019-12-17 MED ORDER — OXYTOCIN-SODIUM CHLORIDE 30-0.9 UT/500ML-% IV SOLN: 2.5000 [IU]/h | INTRAVENOUS | Status: DC

## 2019-12-17 MED ORDER — EPHEDRINE 5 MG/ML INJ: 10.0000 mg | INTRAVENOUS | Status: DC | PRN

## 2019-12-17 MED ORDER — OXYTOCIN-SODIUM CHLORIDE 30-0.9 UT/500ML-% IV SOLN: 1.0000 m[IU]/min | INTRAVENOUS | Status: DC

## 2019-12-17 MED ORDER — FENTANYL-BUPIVACAINE-NACL 0.5-0.125-0.9 MG/250ML-% EP SOLN
12.0000 mL/h | EPIDURAL | Status: DC | PRN
  Filled 2019-12-17: qty 250

## 2019-12-17 MED ORDER — FENTANYL CITRATE (PF) 100 MCG/2ML IJ SOLN: 50.0000 ug | Freq: Once | INTRAMUSCULAR | Status: AC

## 2019-12-17 MED ORDER — PRENATAL MULTIVITAMIN CH
1.0000 | ORAL_TABLET | Freq: Every day | ORAL | Status: DC
  Administered 2019-12-17 – 2019-12-19 (×3): 1 via ORAL
  Filled 2019-12-17 (×4): qty 1

## 2019-12-17 MED ORDER — METHYLERGONOVINE MALEATE 0.2 MG/ML IJ SOLN
INTRAMUSCULAR | Status: AC
  Filled 2019-12-17: qty 3

## 2019-12-17 MED ORDER — TRANEXAMIC ACID-NACL 1000-0.7 MG/100ML-% IV SOLN
INTRAVENOUS | Status: AC
  Filled 2019-12-17: qty 100

## 2019-12-17 MED ORDER — BENZOCAINE-MENTHOL 20-0.5 % EX AERO
1.0000 "application " | INHALATION_SPRAY | CUTANEOUS | Status: DC | PRN
  Administered 2019-12-17 – 2019-12-19 (×2): 1 via TOPICAL
  Filled 2019-12-17 (×3): qty 56

## 2019-12-17 MED ORDER — FENTANYL CITRATE (PF) 2500 MCG/50ML IJ SOLN
INTRAMUSCULAR | Status: DC | PRN
Start: 2019-12-17 — End: 2019-12-17
  Administered 2019-12-17: 12 mL/h via EPIDURAL

## 2019-12-17 MED ORDER — SODIUM CHLORIDE 0.9 % IV BOLUS: 1000.0000 mL | Freq: Once | INTRAVENOUS | Status: DC

## 2019-12-17 MED ORDER — LIDOCAINE HCL (PF) 1 % IJ SOLN
INTRAMUSCULAR | Status: AC
  Administered 2019-12-17: 30 mL via SUBCUTANEOUS
  Filled 2019-12-17: qty 30

## 2019-12-17 MED ORDER — TETANUS-DIPHTH-ACELL PERTUSSIS 5-2.5-18.5 LF-MCG/0.5 IM SUSP: 0.5000 mL | Freq: Once | INTRAMUSCULAR | Status: DC

## 2019-12-17 MED ORDER — SODIUM CHLORIDE 0.9 % IV SOLN
250.0000 mL | INTRAVENOUS | Status: DC | PRN
  Administered 2019-12-17: 250 mL via INTRAVENOUS

## 2019-12-17 MED ORDER — ACETAMINOPHEN 325 MG PO TABS: 650.0000 mg | ORAL_TABLET | ORAL | Status: DC | PRN

## 2019-12-17 MED ORDER — CARBOPROST TROMETHAMINE 250 MCG/ML IM SOLN
INTRAMUSCULAR | Status: AC
  Filled 2019-12-17: qty 1

## 2019-12-17 MED ORDER — PHENYLEPHRINE 40 MCG/ML (10ML) SYRINGE FOR IV PUSH (FOR BLOOD PRESSURE SUPPORT)
80.0000 ug | PREFILLED_SYRINGE | INTRAVENOUS | Status: DC | PRN
  Filled 2019-12-17: qty 10

## 2019-12-17 MED ORDER — OXYTOCIN-SODIUM CHLORIDE 30-0.9 UT/500ML-% IV SOLN: 10.0000 [IU]/h | INTRAVENOUS | Status: DC

## 2019-12-17 MED ORDER — COCONUT OIL OIL
1.0000 "application " | TOPICAL_OIL | Status: DC | PRN
  Administered 2019-12-17: 1 via TOPICAL

## 2019-12-17 MED ORDER — FENTANYL CITRATE (PF) 100 MCG/2ML IJ SOLN
25.0000 ug | INTRAMUSCULAR | Status: DC | PRN
  Administered 2019-12-17: 50 ug via INTRAVENOUS

## 2019-12-17 MED ORDER — ONDANSETRON HCL 4 MG/2ML IJ SOLN: 4.0000 mg | INTRAMUSCULAR | Status: DC | PRN

## 2019-12-17 NOTE — Plan of Care (Signed)

## 2019-12-17 NOTE — Anesthesia Postprocedure Evaluation (Signed)
Anesthesia Post Note  Patient: Paige Stevens  Procedure(s) Performed: AN AD HOC LABOR EPIDURAL     Patient location during evaluation: Mother Baby Anesthesia Type: Epidural Level of consciousness: awake and alert and oriented Pain management: satisfactory to patient Vital Signs Assessment: post-procedure vital signs reviewed and stable Respiratory status: respiratory function stable Cardiovascular status: stable Postop Assessment: no headache, no backache, epidural receding, patient able to bend at knees, no signs of nausea or vomiting, adequate PO intake and able to ambulate Anesthetic complications: no   No complications documented.  Last Vitals:  Vitals:   12/17/19 0619 12/17/19 0743  BP: (!) 123/59 126/72  Pulse: 95 (!) 112  Resp: 18   Temp: 36.8 C 37 C  SpO2: 100% 99%    Last Pain:  Vitals:   12/17/19 0743  TempSrc: Oral  PainSc: 0-No pain   Pain Goal: Patients Stated Pain Goal: 0 (12/16/19 0250)              Epidural/Spinal Function Cutaneous sensation: Normal sensation (12/17/19 0743), Patient able to flex knees: Yes (12/17/19 0743), Patient able to lift hips off bed: Yes (12/17/19 0743), Back pain beyond tenderness at insertion site: No (12/17/19 0743), Progressively worsening motor and/or sensory loss: No (12/17/19 0743)  Karleen Dolphin

## 2019-12-17 NOTE — Progress Notes (Signed)
With RN's assessment vaginal bleeding moderate and passing clots, vital signs 136/91 HR 143. Dr. Dorice Lamas notified and came to room with Dr, Barb Merino.

## 2019-12-17 NOTE — Lactation Note (Signed)
This note was copied from a baby's chart. Lactation Consultation Note  Patient Name: Paige Stevens [id number] Date: 12/17/2019 Reason for consult: Initial assessment;Difficult latch;Mother's request;Early term 37-38.6wks P1, 14 hour ETI female infant. Mom with hx: Abnormal thyroid, Anemia, GHTN and anxiety with depression, mom recently had PPH 1 hour prior to [facility] entering the room. Mom is receiving plasma and afterwards two units of blood. Per mom, she did have breast changes in her pregnancy. LC gave mom hand pump and DEBP due infant not latching well at breast, mom's PPH and mom having flat and invert nipples. Tools given: hand pump in DEBP kit, breast shells and DEBP. Per RN, mom is dizzy and receiving plasma now, infant is cuing, mom hand expressed and pumped 7 mls of colostrum that was feed to infant by curve tip syringe.  LC assisted mom with hand expression and pumping this was in addition to the 4 mls infant received early by spoon. Mom understands to pre-pump breast with hand pump prior to latching infant, BF infant according to hunger cues, 8 to 12+ times within 24 hours, STS. Mom knows to call RN or LC if she needs assistance with latch infant at the breast. Mom knows if she is feeling nausea or dizzy or infant not latching to express or use pump and give infant EBM by spoon or curve tip syringe. Mom understands not to wear breast shells at night or while sleeping will wear in her sports bra. Mom understands to use DEBP every 3 hours for 15 minutes on initial setting. Mom receives [facility] and has hand pump at home. LC discussed [facility] Breast feeding support Group that is free for mom online or in person after discharge.  Mom made aware of O/P services, breastfeeding support groups, community resources, and our phone # for post-discharge questions.    Maternal Data Formula Feeding for Exclusion: Yes Reason for exclusion: Mother's choice to formula and breast  feed on admission Has patient been taught Hand Expression?: Yes Does the patient have breastfeeding experience prior to this delivery?: No  Feeding Feeding Type: Breast Milk  LATCH Score             Interventions Interventions: Breast feeding basics reviewed;Skin to skin;Hand express;DEBP;Hand pump;Shells;Pre-pump if needed;Expressed milk  Lactation Tools Discussed/Used Tools: Shells;Pump;Flanges Flange Size: 27 Shell Type: Inverted Breast pump type: Double-Electric Breast Pump;Manual WIC Program: Yes Pump Review: Setup, frequency, and cleaning;Milk Storage Initiated by:: Vicente Serene, IBCLC Date initiated:: 12/17/19   Consult Status Consult Status: Follow-up Date: 12/18/19 Follow-up type: In-patient    Vicente Serene 12/17/2019, 5:56 PM

## 2019-12-17 NOTE — Progress Notes (Signed)
CSW received consult for hx of Anxiety and Depression.  CSW met with [name] to offer support and complete assessment.    CSW congratulated [name] and FOB on the birth of infant. CSW advised [name] of CSW's role and the reason and the HIPPA policy. [name] reported that it was fine for FOB to remain in the room while CSW spoke with her. [name] reported that she was diagnosed with anxiety and depression in 2016 "I was in the 10th grade". [name] reported that she was never on medications but does report that she was in therapy in the past. [name] reported no current therapy or desire for therapy resources at this time. [name] reported that she feels that she is pretty stable and expressed no other mental health hx. [name] denies SI and HI to this CSW.   CSW inquired from [facility] on who her supports are during this time. [name] reported that she has support from spouse as well as her mother. [name] expressed that she and FOB have all needed items to care for infant with plans for infant to be seen at [facility] for further care. CSW offered [name] and FOB other resources as [name] reported that they needed milk for infant. CSW was advised that [name] does et [facility], however was just unsure as to what milk infant would need. [name] reported no barriers to getting milk at this time or once arrived home.   CSW provided education regarding the baby blues period vs. perinatal mood disorders, discussed treatment and gave resources for mental health follow up if concerns arise.  CSW recommends self-evaluation during the postpartum time period using the New Mom Checklist from Postpartum Progress and encouraged [name] to contact a medical professional if symptoms are noted at any time.   CSW provided review of Sudden Infant Death Syndrome (SIDS) precautions. [name] expressed that she has basinet for infant to sleep in once arrived home.    CSW identifies no further need for intervention and no barriers to discharge at this time.    Paige Stevens, MSW,  LCSW [facility] [place] [phone]

## 2019-12-17 NOTE — Progress Notes (Signed)
RN called MD to the room with report of PPH and tachycardia to the 140s. MD entered room to find patient in bed with visible significant loss of blood. Code Hemorrhage called. 1 L bolus given. Dr. Barb Merino performed manual extraction and retrieved moderate sized clot. Patient noted to have actively bleeding 2nd degree perineal tear which was repaired in usual fashion with 3-0 Vicryl by Dr. Barb Merino and Dr. Shawnie Pons. Stat CBC showed hbg 6.6. Patient transfused with 2 U RBCs and 1 U FFP. Patient BP remained normotensive throughout and tachycardia improved significantly. Cessation of active bleeding was achieved. Continue to monitor VS q1hr.

## 2019-12-17 NOTE — Discharge Summary (Signed)
Postpartum Discharge Summary       Patient Name: Paige Stevens DOB: Feb 08, 2001 MRN: [id number]  Date of admission: 12/16/2019 Delivery date:12/17/2019  Delivering provider: Zettie Cooley E  Date of discharge: 12/19/2019  Admitting diagnosis: Gestational hypertension, third trimester [O13.3] Intrauterine pregnancy: [redacted]w[redacted]d    Secondary diagnosis:  Active Problems:   Supervision of other normal pregnancy, antepartum   Anemia affecting pregnancy in third trimester   Gestational hypertension, third trimester   Encounter for induction of labor  Additional problems: none     Discharge diagnosis: Term Pregnancy Delivered, gHTN                                       Post partum procedures: delayed PPH management, repair of laceration. See below  Augmentation: Pitocin, Cytotec and OP Foley Complications: None  Hospital course: Induction of Labor With Vaginal Delivery   19y.o. yo G1P1001 at 349w6das admitted to the hospital 12/16/2019 for induction of labor.  Indication for induction: Gestational hypertension.  Patient had an uncomplicated labor course as follows: Membrane Rupture Time/Date: 11:51 PM ,12/17/2019   Delivery Method:Vaginal, Spontaneous  Episiotomy: None  Lacerations:  Periurethral;Vaginal  Details of delivery can be found in separate delivery note.  Patient had a routine postpartum course. Patient is discharged home 12/19/19.   Newborn Data: Birth date:12/17/2019  Birth time:3:37 AM  Gender:Female  Living status:Living  Apgars:8 ,9  Weight:3005 g   Magnesium Sulfate received: No BMZ received: No Rhophylac:N/A MMR:N/A T-DaP:Given prenatally Flu: Yes Transfusion: YES see below   Patient had a delayed postpartum hemorrhage on 10/15. Code Hemorrhage called. She received 1 L bolus. She required manual sweep of uterus and repair of laceration. Hgb dropped from 11 to 6.6. In total she received 3u pRBC, 1u FFP with resultant hgb 9.6. No further episodes  of bleeding.   Physical exam  Vitals:   12/18/19 1023 12/18/19 1440 12/18/19 2317 12/19/19 0546  BP: (!) 107/52 127/88 122/76 111/65  Pulse: (!) 105 96 (!) 103 82  Resp: _0 Temp: 98.8 F (37.1 C) 99.4 F (37.4 C) 98.8 F (37.1 C) 98.5 F (36.9 C)  TempSrc: Oral Oral Oral Oral  SpO2: 99% 100% 100% 100%  Weight:      Height:       General: alert, cooperative and no distress Lochia: appropriate Uterine Fundus: firm Incision: N/A DVT Evaluation: No evidence of DVT seen on physical exam. Labs: Lab Results  Component Value Date   WBC 13.6 (H) 12/18/2019   HGB 9.3 (L) 12/18/2019   HCT 27.7 (L) 12/18/2019   MCV 86.6 12/18/2019   PLT 147 (L) 12/18/2019   CMP Latest Ref Rng & Units 12/16/2019  Glucose 70 - 99 mg/dL 101(H)  BUN 6 - 20 mg/dL 9  Creatinine 0.44 - 1.00 mg/dL 0.64  Sodium 135 - 145 mmol/L 135  Potassium 3.5 - 5.1 mmol/L 4.1  Chloride 98 - 111 mmol/L 106  CO2 22 - 32 mmol/L 19(L)  Calcium 8.9 - 10.3 mg/dL 9.1  Total Protein 6.5 - 8.1 g/dL 6.7  Total Bilirubin 0.3 - 1.2 mg/dL 0.6  Alkaline Phos 38 - 126 U/L 236(H)  AST 15 - 41 U/L 31  ALT 0 - 44 U/L 21   Edinburgh Score: Edinburgh Postnatal Depression Scale Screening Tool 12/17/2019  I have been able to laugh and see the funny side  of things. 0  I have looked forward with enjoyment to things. 0  I have blamed myself unnecessarily when things went wrong. 1  I have been anxious or worried for no good reason. 0  I have felt scared or panicky for no good reason. 1  Things have been getting on top of me. 0  I have been so unhappy that I have had difficulty sleeping. 0  I have felt sad or miserable. 0  I have been so unhappy that I have been crying. 0  The thought of harming myself has occurred to me. 0  Edinburgh Postnatal Depression Scale Total 2     After visit meds:  Allergies as of 12/19/2019      Reactions   Latex Rash   Per prenatal records from [place]      Medication List    STOP  taking these medications   Blood Pressure Kit Devi     TAKE these medications   acetaminophen 325 MG tablet Commonly known as: Tylenol Take 2 tablets (650 mg total) by mouth every 4 (four) hours as needed (for pain scale < 4).   ferrous sulfate 325 (65 FE) MG tablet Commonly known as: FerrouSul Take 1 tablet (325 mg total) by mouth every other day.   ibuprofen 600 MG tablet Commonly known as: ADVIL Take 1 tablet (600 mg total) by mouth every 6 (six) hours.   multivitamin-prenatal 27-0.8 MG Tabs tablet Take 1 tablet by mouth daily at 12 noon.        Discharge home in stable condition Infant Feeding: Breast Infant Disposition:home with mother Discharge instruction: per After Visit Summary and Postpartum booklet. Activity: Advance as tolerated. Pelvic rest for 6 weeks.  Diet: routine diet Future Appointments: Future Appointments  Date Time Provider [place]  12/24/2019 10:40 AM [place] None  12/28/2019  2:00 PM Lynnea Ferrier, LCSW [place] None  01/17/2020  2:00 PM Leftwich-Kirby, Kathie Dike, CNM [facility] None   Follow up Visit:   Please schedule this patient for a In person postpartum visit in 6 weeks with the following provider: MD. Additional Postpartum F/U:Postpartum Depression checkup and BP check 1 week  High risk pregnancy complicated by: HTN Delivery mode:  Vaginal, Spontaneous  Anticipated Birth Control:  POPs   12/19/2019 Janet Berlin, MD

## 2019-12-17 NOTE — Progress Notes (Signed)
Labor Progress Note [name] is a 19 y.o. G1P0 at [redacted]w[redacted]d presented for IOL-gHTN.  S: Contractions are feeling stronger. Felt a lot of pressure when going to the bathroom a bit ago.   O:  BP (!) 90/51   Pulse 89   Temp 99 F (37.2 C) (Oral)   Resp 16   Ht 5\' 3"  (1.6 m)   Wt 82.1 kg   LMP 03/27/2019   BMI 32.06 kg/m  EFM: baseline 130, mod variability + accel, early/variable decel  Toco: 1.5-2 minutes   CVE: Dilation: 4.5 Effacement (%): 90 Station: -1 Presentation: Vertex Exam by:: [email or web address], RN  A&P: 19 y.o. G1P0 [redacted]w[redacted]d presented for IOL-gHTN.  #IOL: S/p outpatient FB and cytotec x1 (0500). S/p SROM 10/13 @2200 . Pitocin at 44mu #Pain: PRN fentanyl  #FWB: cat 2 to category 1, improved with position change  #GBS negative #gHTN: preE labs normal, asymptomatic. Pressures are normal. Will continue to monitor.  , MD PGY3 12:25 AM

## 2019-12-17 NOTE — Anesthesia Procedure Notes (Signed)
Epidural Patient location during procedure: OB Start time: 12/17/2019 1:52 AM End time: 12/17/2019 2:05 AM  Staffing Anesthesiologist: Trevor Iha, MD Performed: anesthesiologist   Preanesthetic Checklist Completed: patient identified, IV checked, site marked, risks and benefits discussed, surgical consent, monitors and equipment checked, pre-op evaluation and timeout performed  Epidural Patient position: sitting Prep: DuraPrep and site prepped and draped Patient monitoring: continuous pulse ox and blood pressure Approach: midline Location: L3-L4 Injection technique: LOR air  Needle:  Needle type: Tuohy  Needle gauge: 17 G Needle length: 9 cm and 9 Needle insertion depth: 5 cm cm Catheter type: closed end flexible Catheter size: 19 Gauge Catheter at skin depth: 11 cm Test dose: negative  Assessment Events: blood not aspirated, injection not painful, no injection resistance, no paresthesia and negative IV test  Additional Notes Patient identified. Risks/Benefits/Options discussed with patient including but not limited to bleeding, infection, nerve damage, paralysis, failed block, incomplete pain control, headache, blood pressure changes, nausea, vomiting, reactions to medication both or allergic, itching and postpartum back pain. Confirmed with bedside nurse the patient's most recent platelet count. Confirmed with patient that they are not currently taking any anticoagulation, have any bleeding history or any family history of bleeding disorders. Patient expressed understanding and wished to proceed. All questions were answered. Sterile technique was used throughout the entire procedure. Please see nursing notes for vital signs. Test dose was given through epidural needle and negative prior to continuing to dose epidural or start infusion. Warning signs of high block given to the patient including shortness of breath, tingling/numbness in hands, complete motor block, or any  concerning symptoms with instructions to call for help. Patient was given instructions on fall risk and not to get out of bed. All questions and concerns addressed with instructions to call with any issues. 1 Attempt (S) . Patient tolerated procedure well.

## 2019-12-17 NOTE — Anesthesia Preprocedure Evaluation (Signed)
Anesthesia Evaluation  Patient identified by MRN, date of birth, ID band Patient awake    Reviewed: Allergy & Precautions, NPO status , Patient's Chart, lab work & pertinent test results  Airway Mallampati: II  TM Distance: >3 FB Neck ROM: Full    Dental no notable dental hx. (+) Teeth Intact, Dental Advisory Given   Pulmonary neg pulmonary ROS,    Pulmonary exam normal breath sounds clear to auscultation       Cardiovascular hypertension, Normal cardiovascular exam Rhythm:Regular Rate:Normal  gHTN   Neuro/Psych Anxiety negative neurological ROS     GI/Hepatic negative GI ROS, Neg liver ROS,   Endo/Other  negative endocrine ROS  Renal/GU negative Renal ROS     Musculoskeletal   Abdominal   Peds  Hematology Lab Results      Component                Value               Date                      WBC                      15.5 (H)            12/17/2019                HGB                      11.0 (L)            12/17/2019                HCT                      32.2 (L)            12/17/2019                MCV                      88.7                12/17/2019                PLT                      241                 12/17/2019              Anesthesia Other Findings   Reproductive/Obstetrics (+) Pregnancy                             Anesthesia Physical Anesthesia Plan  ASA: III  Anesthesia Plan: Epidural   Post-op Pain Management:    Induction:   PONV Risk Score and Plan:   Airway Management Planned:   Additional Equipment:   Intra-op Plan:   Post-operative Plan:   Informed Consent: I have reviewed the patients History and Physical, chart, labs and discussed the procedure including the risks, benefits and alternatives for the proposed anesthesia with the patient or authorized representative who has indicated his/her understanding and acceptance.       Plan Discussed  with:   Anesthesia Plan Comments: (37.6 G1P0 w gHTn for LEA)  Anesthesia Quick Evaluation  

## 2019-12-18 LAB — CBC
HCT: 22.2 % — ABNORMAL LOW (ref 36.0–46.0)
HCT: 21.9 % — ABNORMAL LOW (ref 36.0–46.0)
HCT: 27.7 % — ABNORMAL LOW (ref 36.0–46.0)
Hemoglobin: 7.5 g/dL — ABNORMAL LOW (ref 12.0–15.0)
Hemoglobin: 7.2 g/dL — ABNORMAL LOW (ref 12.0–15.0)
Hemoglobin: 9.3 g/dL — ABNORMAL LOW (ref 12.0–15.0)
MCH: 28.7 pg (ref 26.0–34.0)
MCH: 28.6 pg (ref 26.0–34.0)
MCH: 29.1 pg (ref 26.0–34.0)
MCHC: 33.8 g/dL (ref 30.0–36.0)
MCHC: 32.9 g/dL (ref 30.0–36.0)
MCHC: 33.6 g/dL (ref 30.0–36.0)
MCV: 85.1 fL (ref 80.0–100.0)
MCV: 86.9 fL (ref 80.0–100.0)
MCV: 86.6 fL (ref 80.0–100.0)
Platelets: 123 10*3/uL — ABNORMAL LOW (ref 150–400)
Platelets: 123 10*3/uL — ABNORMAL LOW (ref 150–400)
Platelets: 147 10*3/uL — ABNORMAL LOW (ref 150–400)
RBC: 2.61 MIL/uL — ABNORMAL LOW (ref 3.87–5.11)
RBC: 2.52 MIL/uL — ABNORMAL LOW (ref 3.87–5.11)
RBC: 3.2 MIL/uL — ABNORMAL LOW (ref 3.87–5.11)
RDW: 16.9 % — ABNORMAL HIGH (ref 11.5–15.5)
RDW: 17.4 % — ABNORMAL HIGH (ref 11.5–15.5)
RDW: 17 % — ABNORMAL HIGH (ref 11.5–15.5)
WBC: 12.1 10*3/uL — ABNORMAL HIGH (ref 4.0–10.5)
WBC: 11.6 10*3/uL — ABNORMAL HIGH (ref 4.0–10.5)
WBC: 13.6 10*3/uL — ABNORMAL HIGH (ref 4.0–10.5)
nRBC: 0 % (ref 0.0–0.2)
nRBC: 0 % (ref 0.0–0.2)
nRBC: 0.1 % (ref 0.0–0.2)

## 2019-12-18 LAB — PREPARE FRESH FROZEN PLASMA: Unit division: 0

## 2019-12-18 LAB — BPAM FFP
Blood Product Expiration Date: 202110202359
ISSUE DATE / TIME: 202110151620
Unit Type and Rh: 6200

## 2019-12-18 MED ORDER — SODIUM CHLORIDE 0.9% IV SOLUTION: Freq: Once | INTRAVENOUS | Status: DC

## 2019-12-18 NOTE — Progress Notes (Signed)
Patient's post blood hgb 7.5. Made Dr. Selena Batten aware. She said to keep vaginal packing and foley in till the morning and they will come reassess.

## 2019-12-18 NOTE — Progress Notes (Signed)
POSTPARTUM PROGRESS NOTE  Subjective: Paige Stevens is a 19 y.o. G1P1001 s/p SVD at [redacted]w[redacted]d.  PPD#1. Course complicated by delayed postpartum hemorrhage s/p manual sweep, 2u prbc, 1u FFP. Packing removed earlier this AM and she says she is feeling better but tired. Reports no further bleeding since removal of packing. Has not yet ambulated since removal. Tolerating PO. Denies nausea or vomiting. She has  passed flatus. Pain is moderately controlled.    Objective: Blood pressure 121/76, pulse 93, temperature 98.7 F (37.1 C), temperature source Oral, resp. rate 16, height 5\' 3"  (1.6 m), weight 82.1 kg, last menstrual period 03/27/2019, SpO2 100 %, unknown if currently breastfeeding.  Physical Exam:  General: alert, cooperative and no distress Chest: no respiratory distress Abdomen: soft, non-tender  Uterine Fundus: firm and 2cm below umbilicus Extremities: No calf swelling or tenderness  no edema  Recent Labs    12/17/19 1544 12/18/19 0021  HGB 6.6* 7.5*  HCT 20.3* 22.2*    Assessment/Plan: Paige Stevens is a 19 y.o. G1P1001 s/p SVD at [redacted]w[redacted]d.  IOL for gHTN. PPD#1. Course complicated by delayed postpartum hemorrhage requiring manual sweep, repair of actively bleeding second degree laceration.   Routine Postpartum Care: Doing well, pain well-controlled.  -- Continue routine care, lactation support  -- Contraception: POPs -- Feeding: breast  -- Postpartum Hemorrhage/Acute blood loss anemia:s/p manual sweep, 2 units pRBC, 1 unit FFP.  Predelivery Hgb 11 > 6.6 > 7.5 immediately after transfusion. F/u AM CBC. Packing and foley removed this AM.  -f/u AM CBC (pending)  -- Hx depression: SW consult  -- Gestational Hypertension: has been normotensive, though in setting of acute blood loss. Continue to monitor.   Dispo: Plan for discharge PPD#2.  [redacted]w[redacted]d, MD OB Fellow, [facility] 12/18/2019 5:24 AM

## 2019-12-18 NOTE — Progress Notes (Signed)
Transfusion completed at 1020.

## 2019-12-19 LAB — BPAM RBC
Blood Product Expiration Date: 202111152359
Blood Product Expiration Date: 202111192359
Blood Product Expiration Date: 202111192359
ISSUE DATE / TIME: 202110151620
ISSUE DATE / TIME: 202110151620
ISSUE DATE / TIME: 202110160819
Unit Type and Rh: 5100
Unit Type and Rh: 5100
Unit Type and Rh: 5100

## 2019-12-19 LAB — TYPE AND SCREEN
ABO/RH(D): O POS
Antibody Screen: NEGATIVE
Unit division: 0
Unit division: 0
Unit division: 0

## 2019-12-19 MED ORDER — IBUPROFEN 600 MG PO TABS
600.0000 mg | ORAL_TABLET | Freq: Four times a day (QID) | ORAL | 0 refills | Status: DC
Start: 2019-12-19 — End: 2019-12-19

## 2019-12-19 MED ORDER — ACETAMINOPHEN 325 MG PO TABS
650.0000 mg | ORAL_TABLET | ORAL | Status: DC | PRN
Start: 2019-12-19 — End: 2021-10-26

## 2019-12-19 MED ORDER — IBUPROFEN 600 MG PO TABS: 600.0000 mg | ORAL_TABLET | Freq: Three times a day (TID) | ORAL | Status: DC | PRN

## 2019-12-19 NOTE — Lactation Note (Signed)
This note was copied from a baby's chart. Lactation Consultation Note  Patient Name: Paige Stevens OFHQR'F Date: 12/19/2019 Reason for consult: Follow-up assessment;Primapara;1st time breastfeeding;Early term 37-38.6wks  Follow up visit with 54 hours old infant with 0.67% weight loss of P1 mother. Mother states NS has not worked for them. Mother explains pumping helps to bring nipple out but infant does not latch. Mother has been pumping every 3 hours and collecting ~20 mL of EBM. Mother is bottle-feeding EBM and supplementing with formula.    Discussed pacing while bottle-feeding and demonstrated technique. Encouraged feeding inupright position and frequent burping.    Mother has a manual pump to use at home. Reviewed milk storage and proper care of parts.    Promoted maternal rest, hydration and food intake. Encouraged to contact [facility] for support when ready to breastfeed baby and recommended to request help for questions or concerns. Provided LC services brochure.    All questions answered at this time. Mother is expecting to be discharged today.    Maternal Data Formula Feeding for Exclusion: No  Feeding Feeding Type: Breast Milk with Formula added Nipple Type: Slow - flow  Interventions Interventions: Breast feeding basics reviewed;Expressed milk;Hand pump;DEBP;Breast massage  Lactation Tools Discussed/Used Breast pump type: Double-Electric Breast Pump;Manual WIC Program: Yes   Consult Status Consult Status: Complete Date: 12/19/19 Follow-up type: Call as needed    Aveon Colquhoun A Higuera Ancidey 12/19/2019, 10:19 AM

## 2019-12-20 ENCOUNTER — Telehealth: Payer: Self-pay

## 2019-12-20 ENCOUNTER — Other Ambulatory Visit: Payer: Self-pay

## 2019-12-20 MED ORDER — IBUPROFEN 800 MG PO TABS: 800.0000 mg | ORAL_TABLET | Freq: Three times a day (TID) | ORAL | 5 refills | Status: DC | PRN

## 2019-12-20 NOTE — Telephone Encounter (Signed)
Return call to pt regarding triage vm for IBP request.  Pt not ava left vm Consulted w/MD in office regarding pt message IBP 800 mg sent to pt pharmacy with 5 refills.

## 2019-12-24 ENCOUNTER — Other Ambulatory Visit: Payer: Self-pay

## 2019-12-24 ENCOUNTER — Ambulatory Visit (INDEPENDENT_AMBULATORY_CARE_PROVIDER_SITE_OTHER): Payer: Medicaid Other

## 2019-12-24 VITALS — BP 129/88 | HR 90 | Wt 168.0 lb

## 2019-12-24 DIAGNOSIS — Z8759 Personal history of other complications of pregnancy, childbirth and the puerperium: Secondary | ICD-10-CM

## 2019-12-24 NOTE — Progress Notes (Signed)
Subjective:  Paige Stevens is a 19 y.o. female here for BP check hx of GHTN s/p SVD on 12/17/19.   Hypertension ROS: no TIA's, no chest pain on exertion, no dyspnea on exertion and no swelling of ankles.    Objective:  BP 129/88   Pulse 90   Wt 168 lb (76.2 kg)   LMP 03/27/2019   BMI 29.76 kg/m   Appearance alert, well appearing, and in no distress. General exam BP noted to be well controlled today in office.  EPDS = 0  Assessment:   Blood Pressure well controlled.   Plan:  Keep upcoming integrated therapy visit. Keep upcoming routine postpartum visit

## 2019-12-24 NOTE — Progress Notes (Signed)
Agree with A & P. 

## 2019-12-28 ENCOUNTER — Ambulatory Visit (INDEPENDENT_AMBULATORY_CARE_PROVIDER_SITE_OTHER): Payer: Medicaid Other | Admitting: Licensed Clinical Social Worker

## 2019-12-28 ENCOUNTER — Other Ambulatory Visit: Payer: Self-pay

## 2019-12-28 DIAGNOSIS — Z5941 Food insecurity: Secondary | ICD-10-CM

## 2019-12-28 DIAGNOSIS — Z658 Other specified problems related to psychosocial circumstances: Secondary | ICD-10-CM | POA: Diagnosis not present

## 2019-12-29 NOTE — BH Specialist Note (Signed)
Integrated Behavioral Health Initial Visit  MRN: [id number] Name: Paige Stevens  Number of Integrated Behavioral Health Clinician visits:: 1 Session Start time: 2:10pm  Session End time: 2:40pm Total time: 30 mins in person at [facility]   Type of Service: Integrated Behavioral Health- Individual Interpretor:no Interpretor Name and Language: none   Warm Hand Off Completed.       SUBJECTIVE: Mildreth Reek is a 19 y.o. female accompanied by n/a Patient was referred by hospital for mood check. Patient reports the following symptoms/concerns: food insecurity, adjusting to newborn, trouble sleeping  Duration of problem: 2 weeks ; Severity of problem: mild  OBJECTIVE: Mood: good and Affect: normal  Risk of harm to self or others: No risk of harm to self or others.   LIFE CONTEXT: Family and Social: Lives with newborn and father of baby  School/Work: n/a Self-Care: n/a Life Changes: New mom  GOALS ADDRESSED: Patient will: 1. Reduce symptoms of: trouble sleeping 2. Increase knowledge community resources and coping skills to alleviate stress   3. Demonstrate ability to: self manage symptoms   INTERVENTIONS: Interventions utilized: Supportive Counseling   Standardized Assessments completed: Edinburgh completed 12/28/2019 score 1  ASSESSMENT: Patient currently experiencing food insecurity. [name] reports bonding well with newborn and does not reports incidence of depressed mood, crying or feelings of guilt. [name] reports she does not received food assistance, I advised patient to contact case work with DSS and add newborn to her case to start receiving assistance with food and nutrition. Pt expressed understanding when given information regarding [facility] and community food pantries.    Patient may benefit from psychosocial stress   PLAN: 1. Follow up with behavioral health clinician on : as needed  2. Behavioral recommendations: Advice worker  And community food pantries.  3. Referral(s): Provided community resources to address food insecurity 4. "From scale of 1-10, how likely are you to follow plan?":   Gwyndolyn Saxon, LCSW

## 2020-01-17 ENCOUNTER — Other Ambulatory Visit: Payer: Self-pay

## 2020-01-17 ENCOUNTER — Ambulatory Visit (INDEPENDENT_AMBULATORY_CARE_PROVIDER_SITE_OTHER): Payer: Medicaid Other | Admitting: Advanced Practice Midwife

## 2020-01-17 DIAGNOSIS — O139 Gestational [pregnancy-induced] hypertension without significant proteinuria, unspecified trimester: Secondary | ICD-10-CM

## 2020-01-17 DIAGNOSIS — Z3009 Encounter for other general counseling and advice on contraception: Secondary | ICD-10-CM

## 2020-01-17 MED ORDER — NORETHINDRONE 0.35 MG PO TABS: 1.0000 | ORAL_TABLET | Freq: Every day | ORAL | 11 refills | Status: DC

## 2020-01-17 NOTE — Patient Instructions (Signed)
Contraception Choices Contraception, also called birth control, refers to methods or devices that prevent pregnancy. Hormonal methods Contraceptive implant  A contraceptive implant is a thin, plastic tube that contains a hormone. It is inserted into the upper part of the arm. It can remain in place for up to 3 years. Progestin-only injections Progestin-only injections are injections of progestin, a synthetic form of the hormone progesterone. They are given every 3 months by a health care provider. Birth control pills  Birth control pills are pills that contain hormones that prevent pregnancy. They must be taken once a day, preferably at the same time each day. Birth control patch  The birth control patch contains hormones that prevent pregnancy. It is placed on the skin and must be changed once a week for three weeks and removed on the fourth week. A prescription is needed to use this method of contraception. Vaginal ring  A vaginal ring contains hormones that prevent pregnancy. It is placed in the vagina for three weeks and removed on the fourth week. After that, the process is repeated with a new ring. A prescription is needed to use this method of contraception. Emergency contraceptive Emergency contraceptives prevent pregnancy after unprotected sex. They come in pill form and can be taken up to 5 days after sex. They work best the sooner they are taken after having sex. Most emergency contraceptives are available without a prescription. This method should not be used as your only form of birth control. Barrier methods Female condom  A female condom is a thin sheath that is worn over the penis during sex. Condoms keep sperm from going inside a woman's body. They can be used with a spermicide to increase their effectiveness. They should be disposed after a single use. Female condom  A female condom is a soft, loose-fitting sheath that is put into the vagina before sex. The condom keeps sperm  from going inside a woman's body. They should be disposed after a single use. Diaphragm  A diaphragm is a soft, dome-shaped barrier. It is inserted into the vagina before sex, along with a spermicide. The diaphragm blocks sperm from entering the uterus, and the spermicide kills sperm. A diaphragm should be left in the vagina for 6-8 hours after sex and removed within 24 hours. A diaphragm is prescribed and fitted by a health care provider. A diaphragm should be replaced every 1-2 years, after giving birth, after gaining more than 15 lb (6.8 kg), and after pelvic surgery. Cervical cap  A cervical cap is a round, soft latex or plastic cup that fits over the cervix. It is inserted into the vagina before sex, along with spermicide. It blocks sperm from entering the uterus. The cap should be left in place for 6-8 hours after sex and removed within 48 hours. A cervical cap must be prescribed and fitted by a health care provider. It should be replaced every 2 years. Sponge  A sponge is a soft, circular piece of polyurethane foam with spermicide on it. The sponge helps block sperm from entering the uterus, and the spermicide kills sperm. To use it, you make it wet and then insert it into the vagina. It should be inserted before sex, left in for at least 6 hours after sex, and removed and thrown away within 30 hours. Spermicides Spermicides are chemicals that kill or block sperm from entering the cervix and uterus. They can come as a cream, jelly, suppository, foam, or tablet. A spermicide should be inserted into the   vagina with an applicator at least 10-15 minutes before sex to allow time for it to work. The process must be repeated every time you have sex. Spermicides do not require a prescription. Intrauterine contraception Intrauterine device (IUD) An IUD is a T-shaped device that is put in a woman's uterus. There are two types:  Hormone IUD.This type contains progestin, a synthetic form of the hormone  progesterone. This type can stay in place for 3-5 years.  Copper IUD.This type is wrapped in copper wire. It can stay in place for 10 years.  Permanent methods of contraception Female tubal ligation In this method, a woman's fallopian tubes are sealed, tied, or blocked during surgery to prevent eggs from traveling to the uterus. Hysteroscopic sterilization In this method, a small, flexible insert is placed into each fallopian tube. The inserts cause scar tissue to form in the fallopian tubes and block them, so sperm cannot reach an egg. The procedure takes about 3 months to be effective. Another form of birth control must be used during those 3 months. Female sterilization This is a procedure to tie off the tubes that carry sperm (vasectomy). After the procedure, the man can still ejaculate fluid (semen). Natural planning methods Natural family planning In this method, a couple does not have sex on days when the woman could become pregnant. Calendar method This means keeping track of the length of each menstrual cycle, identifying the days when pregnancy can happen, and not having sex on those days. Ovulation method In this method, a couple avoids sex during ovulation. Symptothermal method This method involves not having sex during ovulation. The woman typically checks for ovulation by watching changes in her temperature and in the consistency of cervical mucus. Post-ovulation method In this method, a couple waits to have sex until after ovulation. Summary  Contraception, also called birth control, means methods or devices that prevent pregnancy.  Hormonal methods of contraception include implants, injections, pills, patches, vaginal rings, and emergency contraceptives.  Barrier methods of contraception can include female condoms, female condoms, diaphragms, cervical caps, sponges, and spermicides.  There are two types of IUDs (intrauterine devices). An IUD can be put in a woman's uterus to  prevent pregnancy for 3-5 years.  Permanent sterilization can be done through a procedure for males, females, or both.  Natural family planning methods involve not having sex on days when the woman could become pregnant. This information is not intended to replace advice given to you by your health care provider. Make sure you discuss any questions you have with your health care provider. Document Revised: 02/20/2017 Document Reviewed: 03/23/2016 Elsevier Patient Education  2020 Elsevier Inc.  

## 2020-01-17 NOTE — Progress Notes (Signed)
Post Partum Visit Note  Paige Stevens is a 19 y.o. G35P1001 female who presents for a postpartum visit. She is 4 weeks postpartum following a normal spontaneous vaginal delivery.  I have fully reviewed the prenatal and intrapartum course. The delivery was at 37.6 gestational weeks.  Anesthesia: epidural. Postpartum course has been doing well. Baby is doing well. Baby is feeding by both breast and bottle - Gerber Gentle. Bleeding staining only. Bowel function is normal. Bladder function is normal. Patient is not sexually active. Contraception method is abstinence. Pt is interested in Pill. Postpartum depression screening: negative, score 4.   The pregnancy intention screening data noted above was reviewed. Potential methods of contraception were discussed. The patient elected to proceed with Oral Contraceptive.    Edinburgh Postnatal Depression Scale - 01/17/20 1439      Edinburgh Postnatal Depression Scale:  In the Past 7 Days   I have been able to laugh and see the funny side of things. 0    I have looked forward with enjoyment to things. 0    I have blamed myself unnecessarily when things went wrong. 0    I have been anxious or worried for no good reason. 0    I have felt scared or panicky for no good reason. 0    Things have been getting on top of me. 0    I have been so unhappy that I have had difficulty sleeping. 2    I have felt sad or miserable. 2    I have been so unhappy that I have been crying. 0    The thought of harming myself has occurred to me. 0    Edinburgh Postnatal Depression Scale Total 4          The following portions of the patient's history were reviewed and updated as appropriate: allergies, current medications, past family history, past medical history, past social history, past surgical history and problem list.    Review of Systems Pertinent items noted in HPI and remainder of comprehensive ROS otherwise negative.    Objective:  Blood  pressure 119/81, pulse 84, weight 159 lb (72.1 kg), last menstrual period 03/27/2019, unknown if currently breastfeeding.  VS reviewed, nursing note reviewed,  Constitutional: well developed, well nourished, no distress HEENT: normocephalic CV: normal rate Pulm/chest wall: normal effort Abdomen: soft Neuro: alert and oriented x 3 Skin: warm, dry Psych: affect normal  Assessment:   1. Postpartum care following vaginal delivery --Doing well, bonding with baby, good support at home  2. Postpartum hemorrhage, unspecified type --No s/sx of anemia  3. Gestational hypertension, antepartum --No medications, BP wnl  4. Encounter for counseling regarding contraception --Discussed LARCs as most effective forms of birth control.  Discussed benefits/risks of other methods.  Pt desires POPs.  Discussed failure rates of OCPs with regular use.   - norethindrone (MICRONOR) 0.35 MG tablet; Take 1 tablet (0.35 mg total) by mouth daily.  Dispense: 30 tablet; Refill: 11  Plan:   Essential components of care per ACOG recommendations:  1.  Mood and well being: Patient with negative depression screening today. Reviewed local resources for support.  - Patient does not use tobacco.  - hx of drug use? No    2. Infant care and feeding:  -Patient currently breastmilk feeding? Yes  If breastmilk feeding discussed return to work and pumping. If needed, patient was provided letter for work to allow for every 2-3 hr pumping breaks, and to be granted a  private location to express breastmilk and refrigerated area to store breastmilk. Reviewed importance of draining breast regularly to support lactation. -Social determinants of health (SDOH) reviewed in EPIC. No concerns    3. Sexuality, contraception and birth spacing - Patient does not want a pregnancy in the next year.   - Reviewed forms of contraception in tiered fashion. Patient desired oral progesterone-only contraceptive today.   - Discussed birth  spacing of 18 months  4. Sleep and fatigue -Encouraged family/partner/community support of 4 hrs of uninterrupted sleep to help with mood and fatigue  5. Physical Recovery  - Discussed patients delivery - Patient had periurethral, periclitoral, and left vaginal laceration, perineal healing reviewed. Patient expressed understanding - Patient has urinary incontinence? No  - Patient is safe to resume physical and sexual activity  6.  Health Maintenance - Pap not done during pregnancy   Sharen Counter, CNM [facility] [employer], [facility]

## 2020-02-07 ENCOUNTER — Other Ambulatory Visit: Payer: Self-pay

## 2020-02-07 ENCOUNTER — Ambulatory Visit (INDEPENDENT_AMBULATORY_CARE_PROVIDER_SITE_OTHER): Payer: Medicaid Other

## 2020-02-07 VITALS — BP 120/79 | HR 81 | Ht 63.0 in | Wt 159.0 lb

## 2020-02-07 DIAGNOSIS — R319 Hematuria, unspecified: Secondary | ICD-10-CM | POA: Diagnosis not present

## 2020-02-07 DIAGNOSIS — N390 Urinary tract infection, site not specified: Secondary | ICD-10-CM | POA: Diagnosis not present

## 2020-02-07 LAB — POCT URINALYSIS DIPSTICK
Bilirubin, UA: POSITIVE
Glucose, UA: NEGATIVE
Ketones, UA: NEGATIVE
Nitrite, UA: NEGATIVE
Protein, UA: POSITIVE — AB
Spec Grav, UA: 1.02 (ref 1.010–1.025)
Urobilinogen, UA: 0.2 E.U./dL
pH, UA: 7 (ref 5.0–8.0)

## 2020-02-07 MED ORDER — CEPHALEXIN 500 MG PO CAPS: 500.0000 mg | ORAL_CAPSULE | Freq: Four times a day (QID) | ORAL | 0 refills | Status: AC

## 2020-02-07 NOTE — Progress Notes (Signed)
Patient was assessed and managed by nursing staff during this encounter. I have reviewed the chart and agree with the documentation and plan. I have also made any necessary editorial changes.  Urine findings suggestive of UTI.  Nursing denied findings of flank pain or fever/chills during physical exam.  Warden Fillers, MD 02/07/2020 4:20 PM

## 2020-02-07 NOTE — Progress Notes (Addendum)
SUBJECTIVE: Paige Stevens is a 19 y.o. female 1 Month PP and Breastfeeding, who complains of abdominal, back pain 6/10, urinary frequency, urgency and dysuria x 5 days, without flank pain, fever, chills, or abnormal vaginal discharge.   OBJECTIVE: Appears well, in no apparent distress.  Vital signs are normal. Urine dipstick shows positive for protein and positive for leukocytes, bilirubin, blood.    ASSESSMENT: Dysuria  PLAN:  Keflex 500 mg QID x 7 days sent to Pharmacy. Treatment per orders.  Call or return to clinic prn if these symptoms worsen or fail to improve as anticipated.

## 2020-02-14 LAB — URINE CULTURE

## 2020-05-18 ENCOUNTER — Other Ambulatory Visit: Payer: Self-pay

## 2020-05-18 ENCOUNTER — Ambulatory Visit (INDEPENDENT_AMBULATORY_CARE_PROVIDER_SITE_OTHER): Payer: Medicaid Other

## 2020-05-18 DIAGNOSIS — N912 Amenorrhea, unspecified: Secondary | ICD-10-CM

## 2020-05-18 LAB — POCT URINE PREGNANCY: Preg Test, Ur: NEGATIVE

## 2020-05-18 NOTE — Progress Notes (Addendum)
[name] presents today for UPT. She does complain of nausea but no other concerns. She is currently on birth control pills. LMP: 12/03/2019    OBJECTIVE: Appears well, in no apparent distress.  OB History    Gravida  1   Para  1   Term  1   Preterm      AB      Living  1     SAB      IAB      Ectopic      Multiple  0   Live Births  1          Home UPT Result: neg  In-Office UPT result: neg I have reviewed the patient's medical, obstetrical, social, and family histories, and medications.   ASSESSMENT: Negative  pregnancy test  PLAN Patient is not pregnant at this time. She will follow as needed.

## 2021-02-20 ENCOUNTER — Telehealth: Payer: Self-pay

## 2021-02-20 DIAGNOSIS — Z3009 Encounter for other general counseling and advice on contraception: Secondary | ICD-10-CM

## 2021-02-20 MED ORDER — NORETHINDRONE 0.35 MG PO TABS: 1.0000 | ORAL_TABLET | Freq: Every day | ORAL | 0 refills | Status: DC

## 2021-02-20 NOTE — Telephone Encounter (Signed)
[facility] sent over refill request for patient's OCP. Will send over 1 pack. Pt needs follow up appointment for yearly exam. Has not been seen since 01/2020.

## 2021-02-23 ENCOUNTER — Other Ambulatory Visit: Payer: Self-pay

## 2021-02-23 DIAGNOSIS — Z3009 Encounter for other general counseling and advice on contraception: Secondary | ICD-10-CM

## 2021-02-23 MED ORDER — NORETHINDRONE 0.35 MG PO TABS: 1.0000 | ORAL_TABLET | Freq: Every day | ORAL | 0 refills | Status: DC

## 2021-03-27 ENCOUNTER — Other Ambulatory Visit: Payer: Self-pay

## 2021-03-27 ENCOUNTER — Ambulatory Visit (INDEPENDENT_AMBULATORY_CARE_PROVIDER_SITE_OTHER): Payer: Medicaid Other | Admitting: Licensed Clinical Social Worker

## 2021-03-27 DIAGNOSIS — F331 Major depressive disorder, recurrent, moderate: Secondary | ICD-10-CM | POA: Diagnosis not present

## 2021-03-29 ENCOUNTER — Ambulatory Visit (HOSPITAL_COMMUNITY): Payer: Medicaid Other | Admitting: Physician Assistant
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
## 2021-05-04 IMAGING — US US MFM OB COMP +14 WKS
1 series · 13 of 28 positions shown · non-contrast
Comparison: none

[Series 1: us mfm ob comp +14 wks · 13 of 96 slices shown]
[im 4/96]
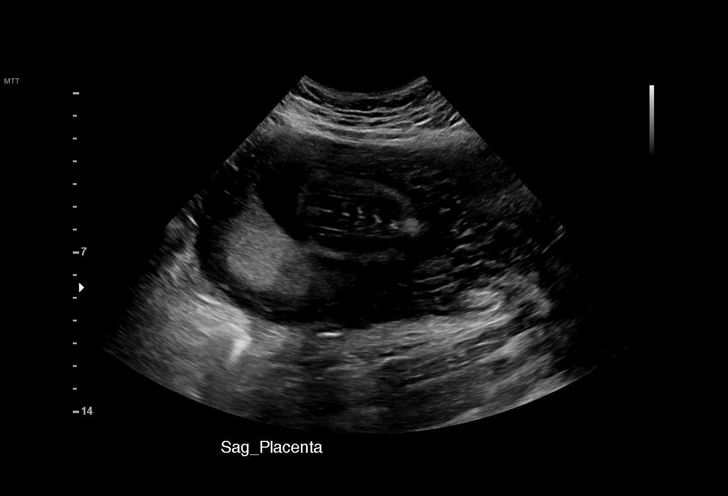
[im 11/96]
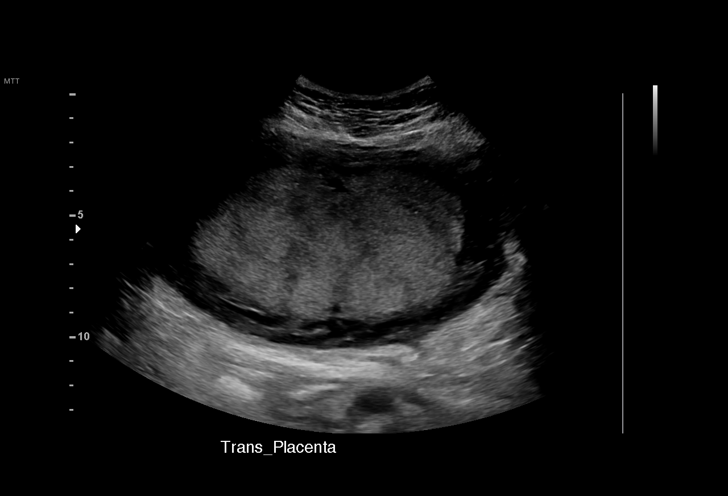
[im 18/96]
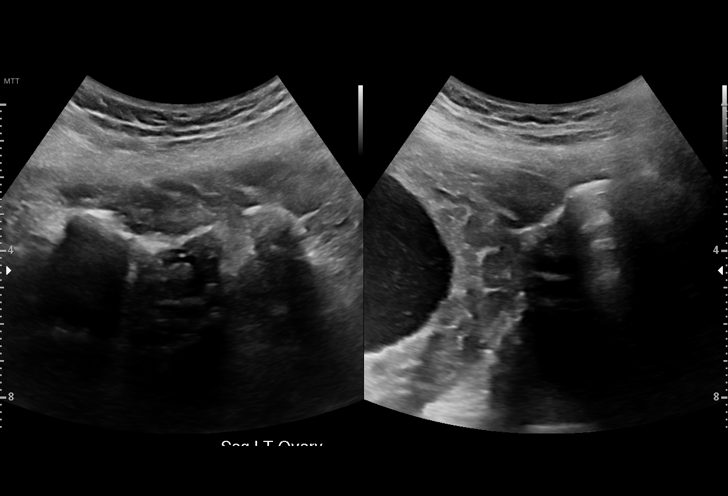
[im 25/96]
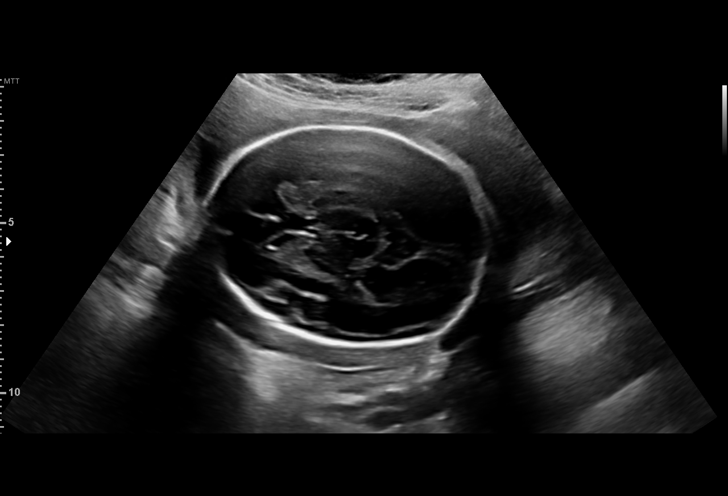
[im 32/96]
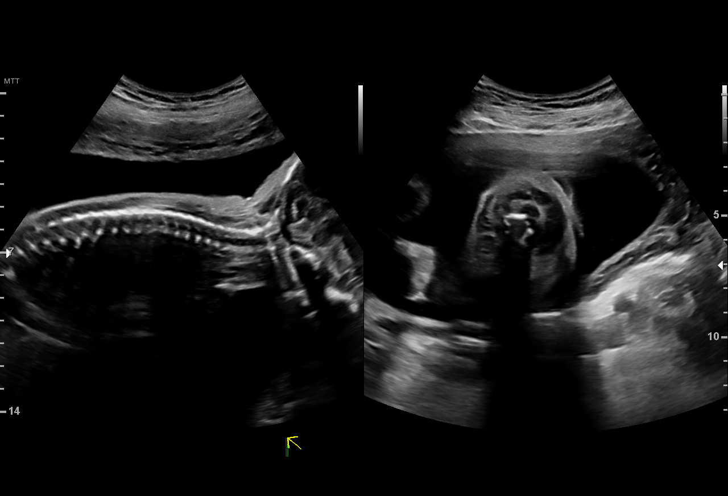
[im 39/96]
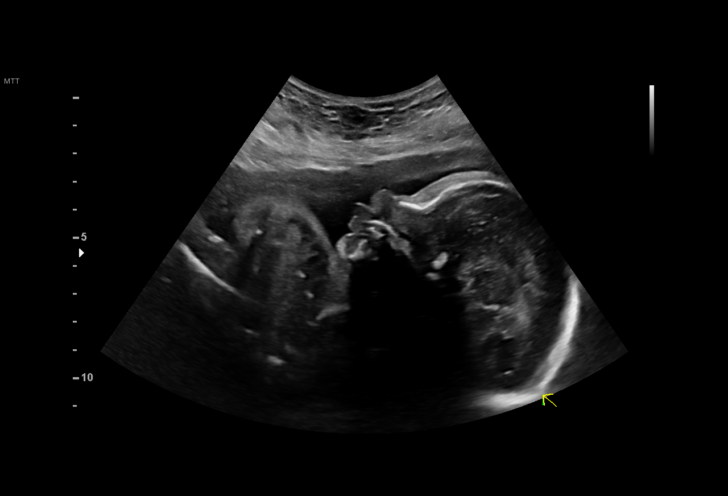
[im 50/96]
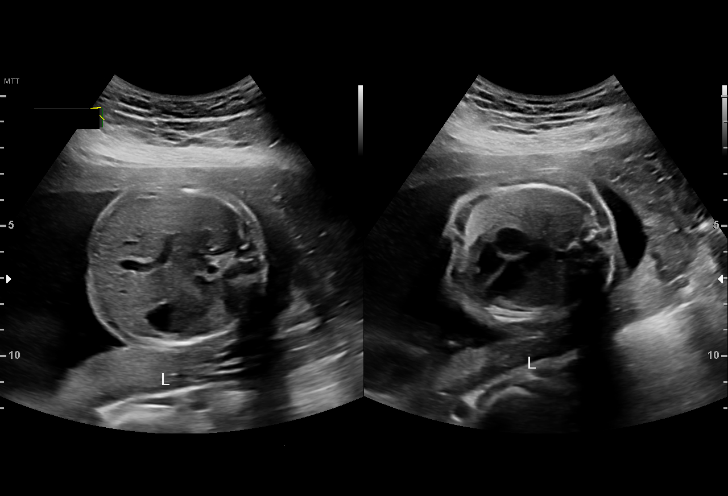
[im 57/96]
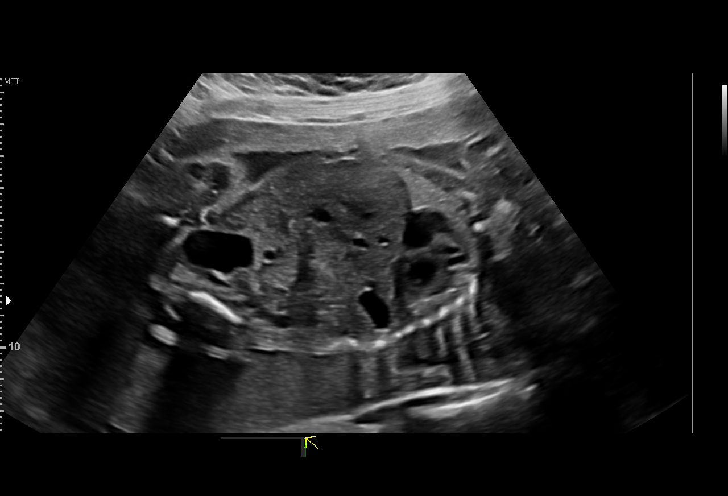
[im 64/96]
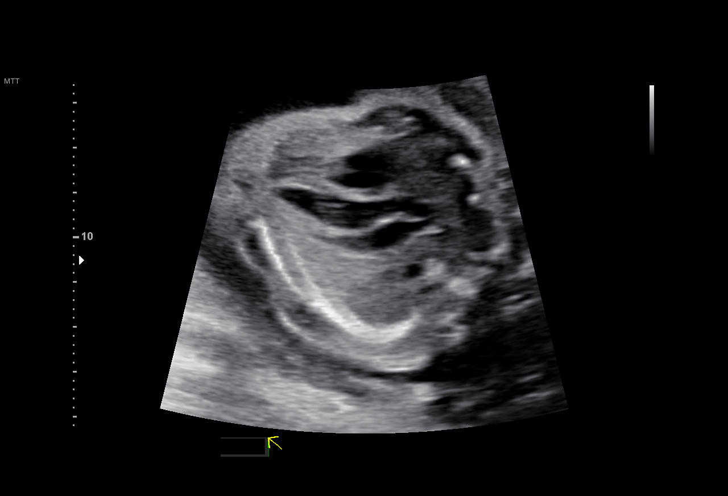
[im 71/96]
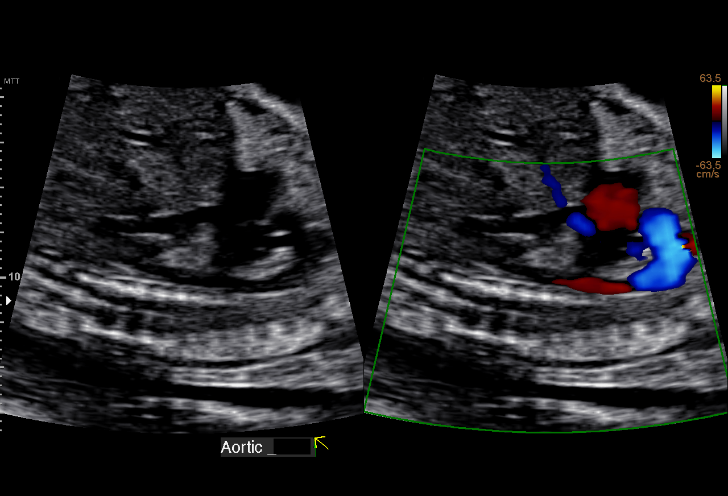
[im 78/96]
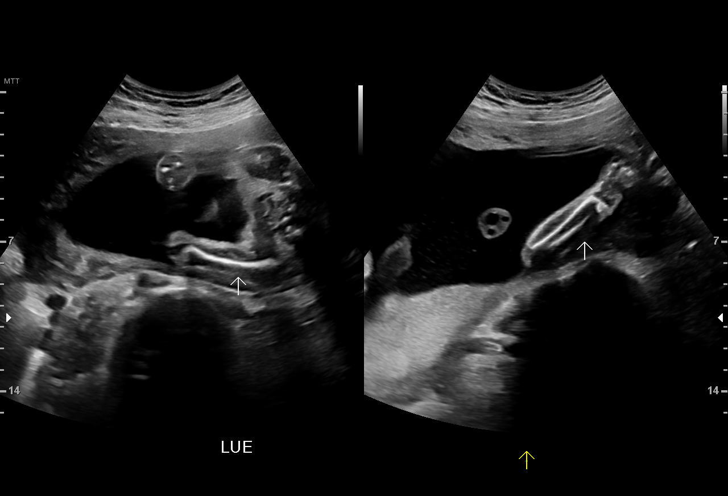
[im 85/96]
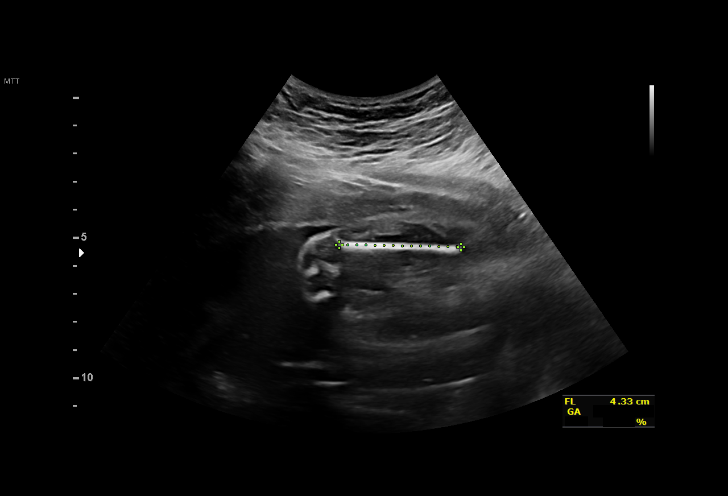
[im 92/96]
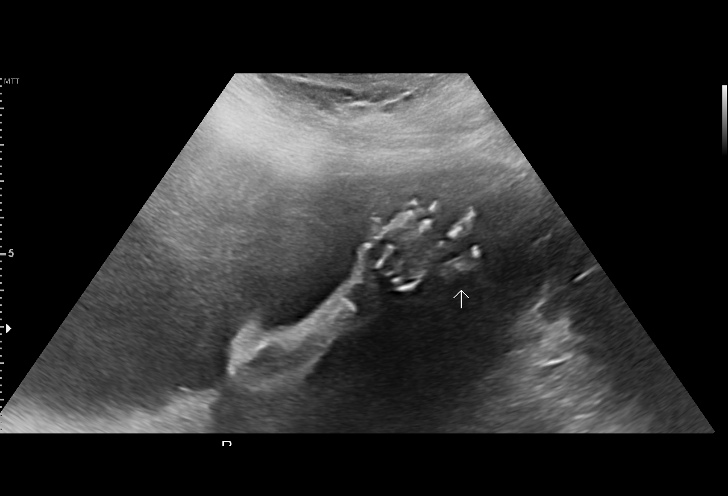

[13 of 28 positions shown; findings below may reference images not displayed]

[REDACTED]
                   [facility]                                     [HOSPITAL] at

Indications

 25 weeks gestation of pregnancy
 Encounter for antenatal screening for
 malformations (LR NIPS, Negative Carrier
 Screen)
 Medical complication of pregnancy (History
 of Abmprmal Thyroid Testing  in 0373, No
 recent test results located in [REDACTED] or [HOSPITAL]
 OB prenatal records)
 Other mental disorder complicating
 pregnancy, unspecified trimester (Anxiety/
 Depression)
Fetal Evaluation

 Num Of Fetuses:          1
 Fetal Heart Rate(bpm):   144
 Cardiac Activity:        Observed
 Presentation:            Cephalic
 Placenta:                Posterior
 P. Cord Insertion:       Visualized, central

 Amniotic Fluid
 AFI FV:      Within normal limits

                             Largest Pocket(cm)

Biometry

 BPD:      63.7  mm     G. Age:  25w 5d         59  %    CI:          73.6  %    70 - 86
                                                         FL/HC:       18.8  %    18.7 -
 HC:      235.9  mm     G. Age:  25w 4d         40  %    HC/AC:       1.06       1.04 -
 AC:      222.4  mm     G. Age:  26w 5d         82  %    FL/BPD:      69.7  %    71 - 87
 FL:       44.4  mm     G. Age:  24w 4d         18  %    FL/AC:       20.0  %    20 - 24
 HUM:      39.1  mm     G. Age:  24w 0d         11  %
 CER:      27.7  mm     G. Age:  24w 5d         35  %

 LV:        7.4  mm
 CM:        7.4  mm

 Est. FW:     854   gm   1 lb 14 oz      63  %
OB History

 Blood Type:   O+
 Gravidity:    1         Term:   0        Prem:   0        SAB:   0
 TOP:          0       Ectopic:  0        Living: 0
Gestational Age

 LMP:           25w 2d        Date:  03/27/19                 EDD:   01/01/20
 Clinical EDD:  24w 2d                                        EDD:   01/08/20
 U/S Today:     25w 5d                                        EDD:   12/29/19
 Best:          25w 2d     Det. By:  LMP  (03/27/19)          EDD:   01/01/20
Anatomy

 Cranium:               Appears normal         Aortic Arch:            Appears normal
 Cavum:                 Appears normal         Ductal Arch:            Not well visualized
 Ventricles:            Appears normal         Diaphragm:              Appears normal
 Choroid Plexus:        Not well visualized    Stomach:                Appears normal, left
                                                                       sided
 Cerebellum:            Appears normal         Abdomen:                Appears normal
 Posterior Fossa:       Appears normal         Abdominal Wall:         Appears nml (cord
                                                                       insert, abd wall)
 Nuchal Fold:           Not applicable (>20    Cord Vessels:           Appears normal (3
                        wks GA)                                        vessel cord)
 Face:                  Appears normal         Kidneys:                Appear normal
                        (orbits and profile)
 Lips:                  Appears normal         Bladder:                Appears normal
 Thoracic:              Appears normal         Spine:                  Appears normal
 Heart:                 Appears normal         Upper Extremities:      Appears normal
                        (4CH, axis, and
                        situs)
 RVOT:                  Appears normal         Lower Extremities:      Appears normal
 LVOT:                  Appears normal

 Other:  Fetus appears to be a male. Nasal bone visualized. Heels and Right
         5th digit visualized.  Technically difficult due to fetal position.
Cervix Uterus Adnexa

 Cervix
 Length:            4.6  cm.
 Normal appearance by transabdominal scan.
 Uterus
 No abnormality visualized.

 Right Ovary
 Within normal limits. No adnexal mass visualized.

 Left Ovary
 Within normal limits. No adnexal mass visualized.

 Cul De Sac
 No free fluid seen.

 Adnexa
 No abnormality visualized.
Impression

 Single intrauterine pregnancy here for follow up anatomy
 Normal anatomy with measurements consistent with dates
 There is good fetal movement and amniotic fluid volume
 Suboptimal views of the fetal anatomy were obtained
 secondary to fetal position.

 Low risk NIPS and neg carrier screening
Recommendations

 Follow up growth and anatomy in 4 weeks.

## 2021-05-15 ENCOUNTER — Ambulatory Visit (HOSPITAL_COMMUNITY): Payer: Medicaid Other | Admitting: Physician Assistant

## 2021-06-07 ENCOUNTER — Ambulatory Visit (HOSPITAL_COMMUNITY): Payer: Medicaid Other | Admitting: Licensed Clinical Social Worker

## 2021-06-28 ENCOUNTER — Ambulatory Visit (HOSPITAL_COMMUNITY): Payer: Self-pay | Admitting: Licensed Clinical Social Worker

## 2021-07-13 ENCOUNTER — Ambulatory Visit (INDEPENDENT_AMBULATORY_CARE_PROVIDER_SITE_OTHER): Payer: Medicaid Other | Admitting: Emergency Medicine

## 2021-07-13 VITALS — BP 111/76 | HR 84 | Wt 186.2 lb

## 2021-07-13 DIAGNOSIS — N912 Amenorrhea, unspecified: Secondary | ICD-10-CM | POA: Diagnosis not present

## 2021-07-13 LAB — POCT URINE PREGNANCY: Preg Test, Ur: NEGATIVE

## 2021-07-13 NOTE — Progress Notes (Signed)
Ms. Natasha Bence presents today for UPT. She complains of nausea without vomiting, mood swings and tender breasts. ? ?LMP: 05/06/21 ?   ?OBJECTIVE: Appears well, in no apparent distress.  ?OB History   ? ? Gravida  ?1  ? Para  ?1  ? Term  ?1  ? Preterm  ?   ? AB  ?   ? Living  ?1  ?  ? ? SAB  ?   ? IAB  ?   ? Ectopic  ?   ? Multiple  ?0  ? Live Births  ?1  ?   ?  ?  ? ?Home UPT Result: negative  ?In-Office UPT result: neagtive ?I have reviewed the patient's medical, obstetrical, social, and family histories, and medications.  ? ?ASSESSMENT: negative pregnancy test ? ?PLAN ?Beta HCG completed today d/t LMP 05/06/21 with pregnancy symptoms ? ?  ?

## 2021-07-14 LAB — BETA HCG QUANT (REF LAB): hCG Quant: <1 m[IU]/mL

## 2021-07-19 ENCOUNTER — Ambulatory Visit (HOSPITAL_COMMUNITY): Payer: Self-pay | Admitting: Licensed Clinical Social Worker

## 2021-07-20 IMAGING — US US MFM OB FOLLOW-UP
1 series · 14 of 28 positions shown · non-contrast
Comparison: none

[Series 1: us mfm ob follow-up · 50 acquisitions, 14 frames shown]
[im 2/50]
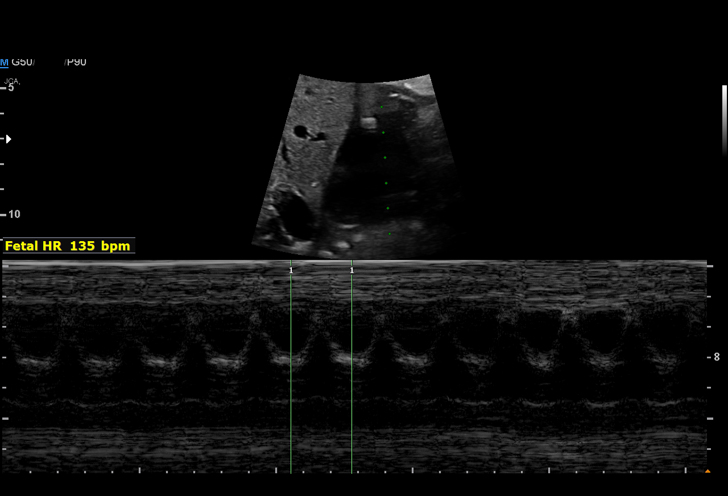
[im 6/50]
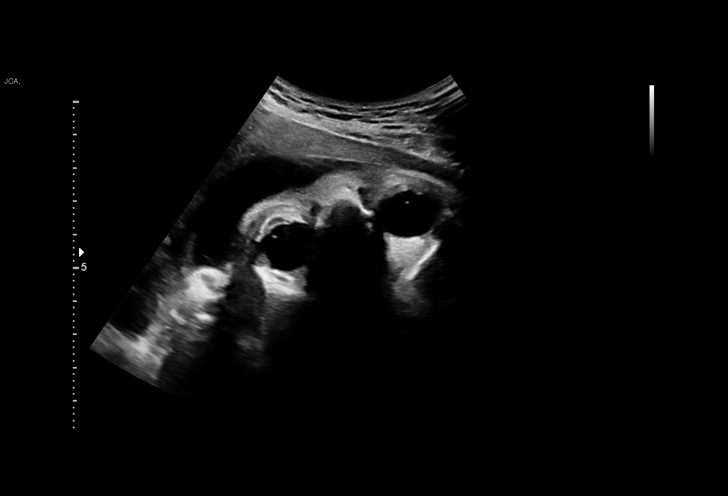
[im 10/50]
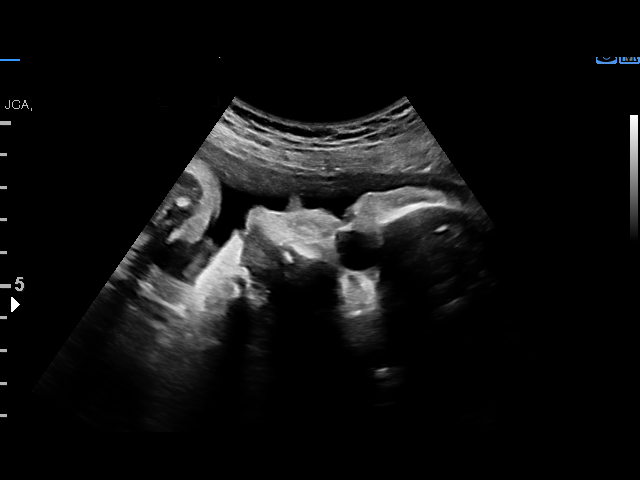
[im 13/50]
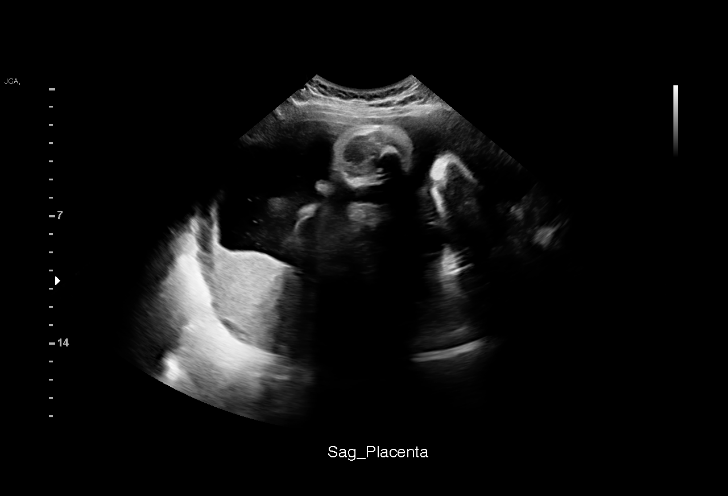
[im 17/50]
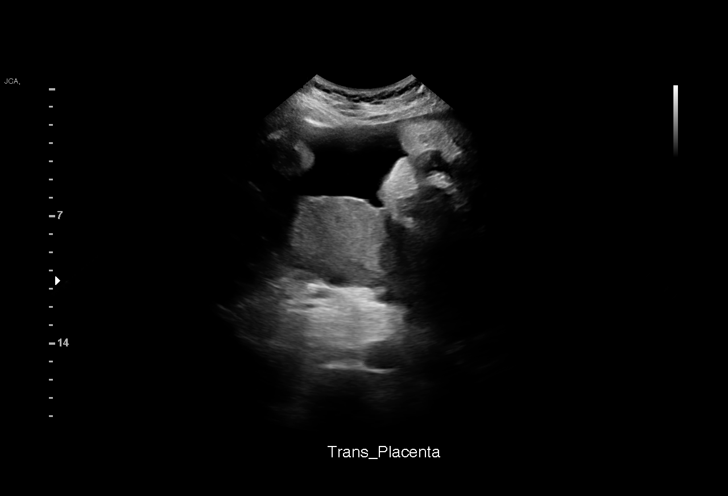
[im 20/50]
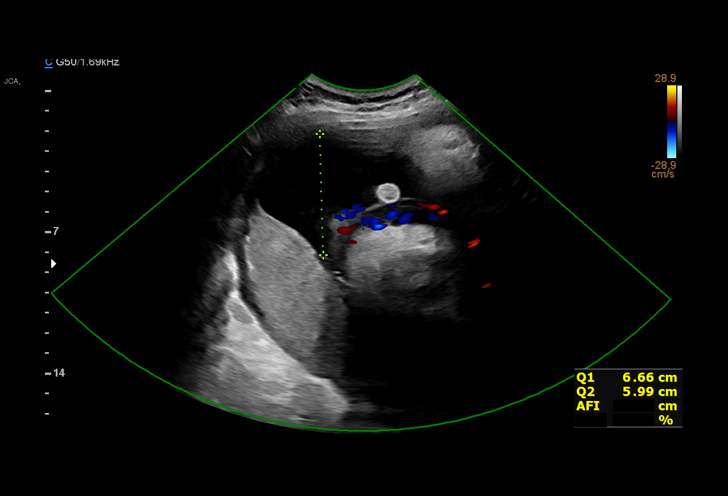
[im 24/50]
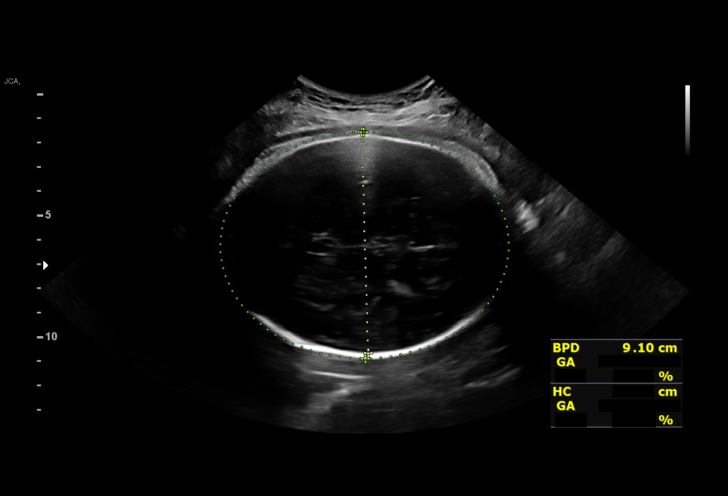
[im 28/50]
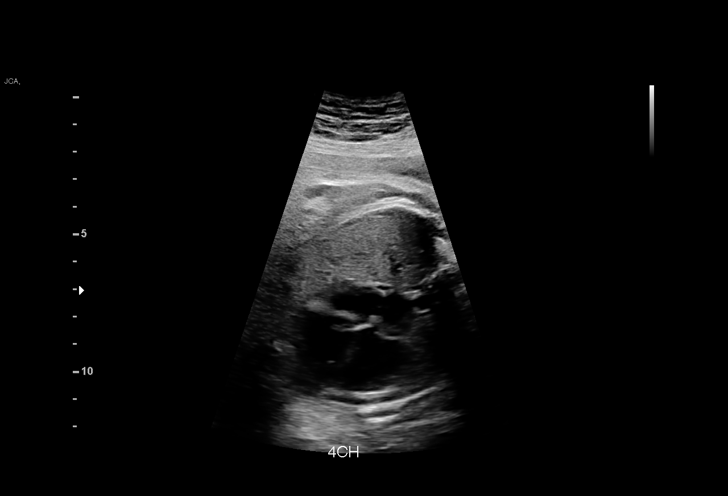
[im 31/50]
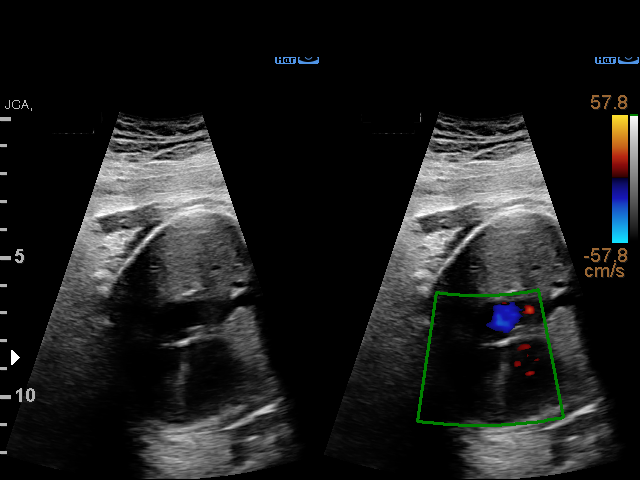
[im 35/50]
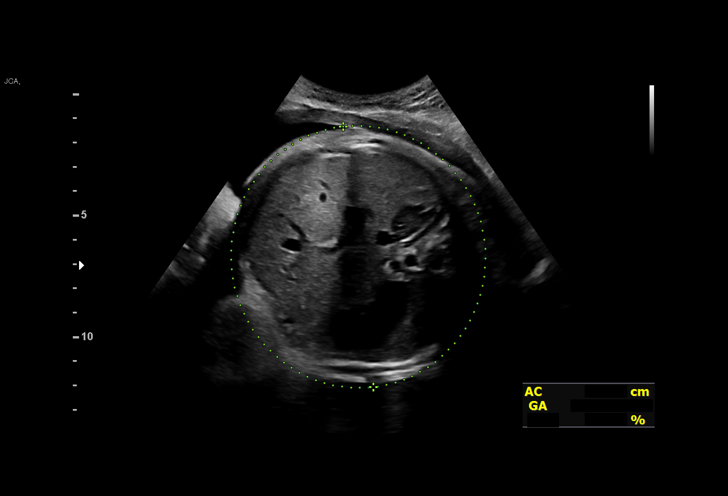
[im 39/50]
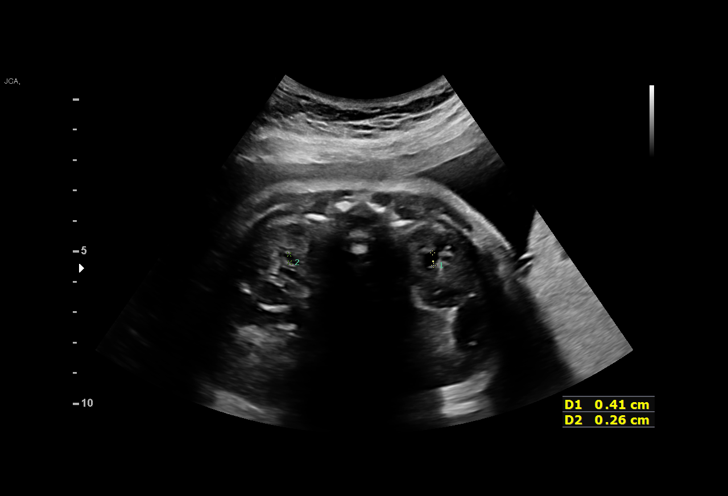
[im 42/50]
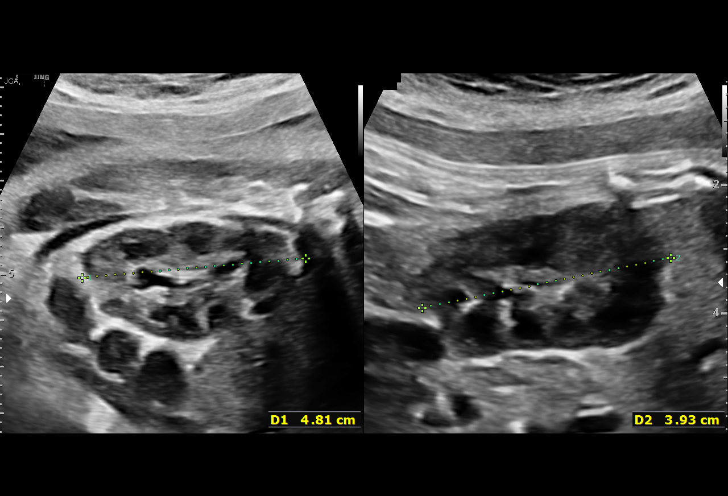
[im 46/50]
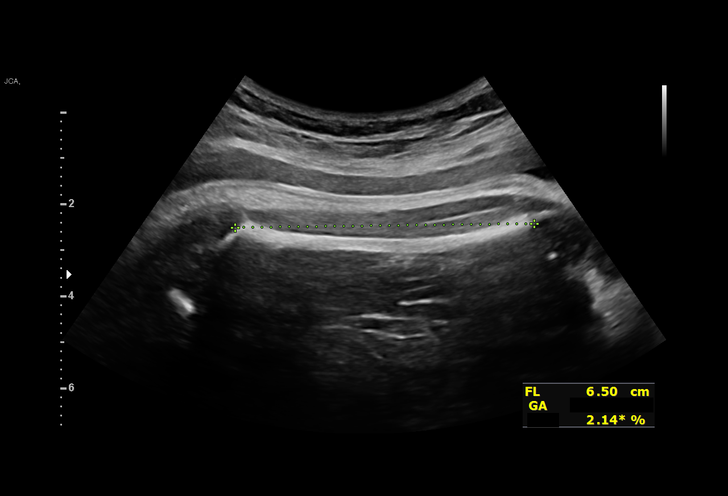
[im 50/50]
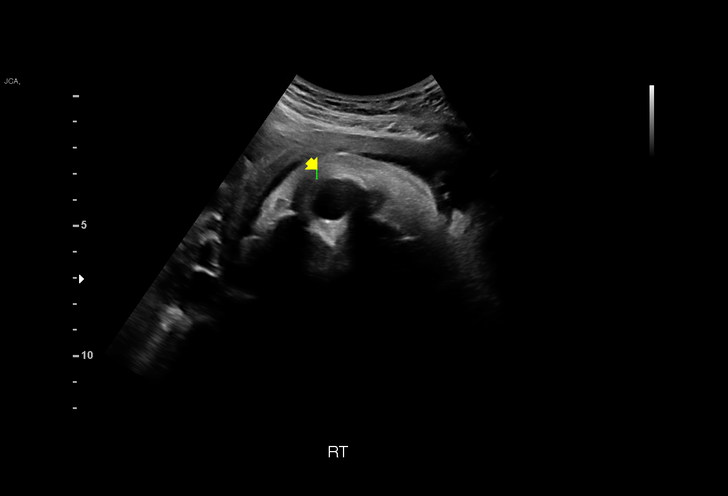

[14 of 28 positions shown; findings below may reference images not displayed]

[REDACTED]
                   [facility]                                    [HOSPITAL] at

Indications

 Right Dacrocystocele
 Other mental disorder complicating
 pregnancy, unspecified trimester (Anxiety/
 Depression)
 Encounter for antenatal screening for
 malformations (LR NIPS, Negative Carrier
 Screen)
 36 weeks gestation of pregnancy
Fetal Evaluation

 Num Of Fetuses:         1
 Fetal Heart Rate(bpm):  135
 Cardiac Activity:       Observed
 Presentation:           Cephalic
 Placenta:               Posterior
 P. Cord Insertion:      Previously Visualized

 Amniotic Fluid
 AFI FV:      Within normal limits

 AFI Sum(cm)     %Tile       Largest Pocket(cm)
 21.43           81

 RUQ(cm)       RLQ(cm)       LUQ(cm)        LLQ(cm)

Biometry

 BPD:      91.3  mm     G. Age:  37w 0d         79  %    CI:        74.09   %    70 - 86
                                                         FL/HC:      19.5   %    20.1 -
 HC:      336.8  mm     G. Age:  38w 4d         77  %    HC/AC:      1.01        0.93 -
 AC:      335.1  mm     G. Age:  37w 3d         87  %    FL/BPD:     71.9   %    71 - 87
 FL:       65.6  mm     G. Age:  33w 6d        3.4  %    FL/AC:      19.6   %    20 - 24
 HUM:      56.2  mm     G. Age:  32w 5d        < 5  %

 LV:        4.9  mm

 Est. FW:    2010  gm      6 lb 9 oz     62  %
OB History

 Blood Type:   O+
 Gravidity:    1         Term:   0        Prem:   0        SAB:   0
 TOP:          0       Ectopic:  0        Living: 0
Gestational Age

 LMP:           36w 2d        Date:  03/27/19                 EDD:   01/01/20
 Clinical EDD:  35w 2d                                        EDD:   01/08/20
 U/S Today:     36w 5d                                        EDD:   12/29/19
 Best:          36w 2d     Det. By:  LMP  (03/27/19)          EDD:   01/01/20
Anatomy

 Cranium:               Appears normal         Aortic Arch:            Previously seen
 Cavum:                 Appears normal         Ductal Arch:            Previously seen
 Ventricles:            Appears normal         Diaphragm:              Appears normal
 Choroid Plexus:        Previously seen        Stomach:                Appears normal, left
                                                                       sided
 Cerebellum:            Previously seen        Abdomen:                Appears normal
 Posterior Fossa:       Previously seen        Abdominal Wall:         Previously seen
 Nuchal Fold:           Not applicable (>20    Cord Vessels:           Previously seen
                        wks GA)
 Face:                  Orbits appear          Kidneys:                Appear normal
                        normal
 Lips:                  Previously seen        Bladder:                Appears normal
 Thoracic:              Appears normal         Spine:                  Previously seen
 Heart:                 Appears normal         Upper Extremities:      Previously seen
                        (4CH, axis, and
                        situs)
 RVOT:                  Previously seen        Lower Extremities:      Previously seen
 LVOT:                  Previously seen

 Other:  (R) Lacrimal duct cyst, Other anatomy previously imaged and
         appears normal. Normal genaitalia.
Impression

 Patient returned for fetal growth assessment and evaluation
 of dacryocystocele seen on previous ultrasound.  She does
 not have gestational diabetes.

 Amniotic fluid is normal and good fetal activity is seen .Fetal
 growth is appropriate for gestational age .  Face and both
 orbits look normal and there is no evidence of
 dacryocystocele.
 We reassured the patient of the findings.
Recommendations

 -Follow-up scans as clinically indicated.
                 BERDURI

## 2021-09-10 ENCOUNTER — Ambulatory Visit (INDEPENDENT_AMBULATORY_CARE_PROVIDER_SITE_OTHER): Payer: Medicaid Other

## 2021-09-10 VITALS — BP 118/76 | HR 89 | Ht 63.0 in | Wt 183.0 lb

## 2021-09-10 DIAGNOSIS — Z3201 Encounter for pregnancy test, result positive: Secondary | ICD-10-CM | POA: Diagnosis not present

## 2021-09-10 LAB — POCT URINE PREGNANCY: Preg Test, Ur: POSITIVE — AB

## 2021-09-10 NOTE — Progress Notes (Signed)
Paige Stevens presents today for UPT. She has no unusual complaints.  LMP:07/22/2021    OBJECTIVE: Appears well, in no apparent distress.  OB History     Gravida  2   Para  1   Term  1   Preterm      AB      Living  1      SAB      IAB      Ectopic      Multiple  0   Live Births  1          Home UPT Result:POSITIVE In-Office UPT result: POSITIVE  I have reviewed the patient's medical, obstetrical, social, and family histories, and medications.   ASSESSMENT: Positive pregnancy test LMP 07/22/2021 EDD 04/28/2022 [place] [redacted]w[redacted]d  PLAN Prenatal care to be completed at: [facility]

## 2021-09-18 ENCOUNTER — Ambulatory Visit (INDEPENDENT_AMBULATORY_CARE_PROVIDER_SITE_OTHER): Payer: Medicaid Other

## 2021-09-18 VITALS — BP 120/74 | HR 80 | Ht 63.0 in | Wt 183.6 lb

## 2021-09-18 DIAGNOSIS — Z3481 Encounter for supervision of other normal pregnancy, first trimester: Secondary | ICD-10-CM

## 2021-09-18 DIAGNOSIS — Z3480 Encounter for supervision of other normal pregnancy, unspecified trimester: Secondary | ICD-10-CM

## 2021-09-18 DIAGNOSIS — O3680X0 Pregnancy with inconclusive fetal viability, not applicable or unspecified: Secondary | ICD-10-CM

## 2021-09-18 DIAGNOSIS — Z3A01 Less than 8 weeks gestation of pregnancy: Secondary | ICD-10-CM | POA: Diagnosis not present

## 2021-09-18 MED ORDER — GOJJI WEIGHT SCALE MISC: 1.0000 | 0 refills | Status: DC

## 2021-09-18 MED ORDER — BLOOD PRESSURE KIT DEVI: 1.0000 | 0 refills | Status: DC

## 2021-09-18 NOTE — Progress Notes (Signed)
New OB Intake  I connected with  Paige Stevens on 09/18/21 at 10:15 AM EDT by in person and verified that I am speaking with the correct person using two identifiers. Nurse is located at [facility] and pt is located at [place].  I discussed the limitations, risks, security and privacy concerns of performing an evaluation and management service by telephone and the availability of in person appointments. I also discussed with the patient that there may be a patient responsible charge related to this service. The patient expressed understanding and agreed to proceed.  I explained I am completing New OB Intake today. We discussed her EDD of 05/11/22 that is based on early first trimester u/s. Pt is G2/P1001. I reviewed her allergies, medications, Medical/Surgical/OB history, and appropriate screenings. I informed her of [facility] services. [facility] information placed in AVS. Based on history, this is a/an  pregnancy uncomplicated .   Patient Active Problem List   Diagnosis Date Noted   Encounter for induction of labor 12/16/2019   Gestational hypertension, third trimester 12/15/2019   [redacted] weeks gestation of pregnancy 12/15/2019   Anemia affecting pregnancy in third trimester 11/23/2019   Supervision of other normal pregnancy, antepartum 09/13/2019   Family history of thyroid cancer 11/13/2015    Concerns addressed today  Delivery Plans Plans to deliver at [facility]. Patient given information for [facility] website for more information about [facility]. Patient is interested in water birth. Offered upcoming OB visit with CNM to discuss further.  MyChart/Babyscripts MyChart access verified. I explained pt will have some visits in office and some virtually. Babyscripts instructions given and order placed. Patient verifies receipt of registration text/e-mail. Account successfully created and app downloaded.  Blood Pressure Cuff/Weight Scale Blood pressure cuff ordered for  patient to pick-up from [employer]. Explained after first prenatal appt pt will check weekly and document in Babyscripts. Patient does not  have weight scale. Weight scale ordered for patient to pick up from [employer].   Anatomy US Explained first scheduled [place] will be around 19 weeks. Dating and viability scan performed today.  Labs Discussed Avelina Laine genetic screening with patient. Would like both Panorama and Horizon drawn at new OB visit. Routine prenatal labs needed.  Covid Vaccine Patient has covid vaccine.   Is patient a CenteringPregnancy candidate?  Declined Declined due to Group Setting Not a candidate due to  Centering Patient" indicated on sticky note  Social Determinants of Health Food Insecurity: Patient denies food insecurity. WIC Referral: Patient is interested in referral to [facility].  Transportation: Patient denies transportation needs. Childcare: Discussed no children allowed at ultrasound appointments. Offered childcare services; patient declines childcare services at this time.  First visit review I reviewed new OB appt with pt. I explained she will have a provider visit that includes prenatal labs, pap smear, std screening, FHR, and discuss plan of care for pregnancy. Explained pt will be seen by Mariel Aloe at first visit; encounter routed to appropriate provider. Explained that patient will be seen by pregnancy navigator following visit with provider.   Hamilton Capri, RN 09/18/2021  10:19 AM

## 2021-09-25 NOTE — Progress Notes (Signed)
Patient was assessed and managed by nursing staff during this encounter. I have reviewed the chart and agree with the documentation and plan. I have also made any necessary editorial changes.  Scheryl Darter, MD 09/25/2021 2:54 PM

## 2021-10-26 ENCOUNTER — Ambulatory Visit (INDEPENDENT_AMBULATORY_CARE_PROVIDER_SITE_OTHER): Payer: Medicaid Other | Admitting: Obstetrics and Gynecology

## 2021-10-26 ENCOUNTER — Other Ambulatory Visit (HOSPITAL_COMMUNITY)
Admission: RE | Admit: 2021-10-26 | Discharge: 2021-10-26 | Disposition: A | Discharge status: Home / Self Care | Payer: Medicaid Other | Source: Ambulatory Visit | Attending: Obstetrics and Gynecology | Admitting: Obstetrics and Gynecology

## 2021-10-26 VITALS — BP 117/79 | HR 105 | Wt 178.2 lb

## 2021-10-26 DIAGNOSIS — Z3A11 11 weeks gestation of pregnancy: Secondary | ICD-10-CM | POA: Diagnosis not present

## 2021-10-26 DIAGNOSIS — Z3480 Encounter for supervision of other normal pregnancy, unspecified trimester: Secondary | ICD-10-CM

## 2021-10-26 DIAGNOSIS — Z8759 Personal history of other complications of pregnancy, childbirth and the puerperium: Secondary | ICD-10-CM | POA: Insufficient documentation

## 2021-10-26 DIAGNOSIS — Z3481 Encounter for supervision of other normal pregnancy, first trimester: Secondary | ICD-10-CM | POA: Diagnosis not present

## 2021-10-26 DIAGNOSIS — O219 Vomiting of pregnancy, unspecified: Secondary | ICD-10-CM

## 2021-10-26 MED ORDER — ASPIRIN 81 MG PO CHEW: 81.0000 mg | CHEWABLE_TABLET | Freq: Every day | ORAL | 7 refills | Status: DC

## 2021-10-26 MED ORDER — DOXYLAMINE-PYRIDOXINE 10-10 MG PO TBEC: 2.0000 | DELAYED_RELEASE_TABLET | Freq: Every day | ORAL | 5 refills | Status: DC

## 2021-10-26 NOTE — Progress Notes (Signed)
NOB in office, denies pain. Intake completed on 09/18/21

## 2021-10-26 NOTE — Progress Notes (Signed)
INITIAL PRENATAL VISIT NOTE  Subjective:  Paige Stevens is a 21 y.o. G2P1001 at 80w6dby early ultrasound being seen today for her initial prenatal visit.  She has an obstetric history significant for gestational hypertension. She has a medical history significant for depression and anemia.  Patient reports nausea and vomiting.  Contractions: Not present. Vag. Bleeding: None.   . Denies leaking of fluid.    Past Medical History:  Diagnosis Date   Anemia    Anxiety    Depression    Hypertension     Past Surgical History:  Procedure Laterality Date   NO PAST SURGERIES      OB History  Gravida Para Term Preterm AB Living  _0 SAB IAB Ectopic Multiple Live Births        0 1    # Outcome Date GA Lbr Len/2nd Weight Sex Delivery Anes PTL Lv  2 Current           1 Term 12/17/19 [id number] / 00:12 6 lb 10 oz (3.005 kg) M Vag-Spont EPI  LIV     Birth Comments: WDL    Social History   Socioeconomic History   Marital status: Significant Other    Spouse name: [employer] coach Number of children: Not on file   Years of education: Not on file   Highest education level: Not on file  Occupational History   Not on file  Tobacco Use   Smoking status: Never   Smokeless tobacco: Never  Vaping Use   Vaping Use: Never used  Substance and Sexual Activity   Alcohol use: Not Currently    Comment: occ prior to pregnancy   Drug use: Never   Sexual activity: Yes    Partners: Male    Birth control/protection: None    Comment: Currently pregnant  Other Topics Concern   Not on file  Social History Narrative   Not on file   Social Determinants of Health   Financial Resource Strain: Not on file  Food Insecurity: Not on file  Transportation Needs: Not on file  Physical Activity: Not on file  Stress: Not on file  Social Connections: Not on file    Family History  Problem Relation Age of Onset   Hypothyroidism Mother    Hypertension Father    Diabetes Paternal  Grandmother    Hypertension Paternal Grandmother    Hypertension Paternal Grandfather      Current Outpatient Medications:    aspirin 81 MG chewable tablet, Chew 1 tablet (81 mg total) by mouth daily. Start at 14 weeks, Disp: 30 tablet, Rfl: 7   Blood Pressure Monitoring (BLOOD PRESSURE KIT) DEVI, 1 kit by Does not apply route once a week., Disp: 1 each, Rfl: 0   Doxylamine-Pyridoxine (DICLEGIS) 10-10 MG TBEC, Take 2 tablets by mouth at bedtime. If symptoms persist, add one tablet in the morning and one in the afternoon, Disp: 100 tablet, Rfl: 5   Misc. Devices (GOJJI WEIGHT SCALE) MISC, 1 Device by Does not apply route every 30 (thirty) days., Disp: 1 each, Rfl: 0   Prenatal Vit-Fe Fumarate-FA (MULTIVITAMIN-PRENATAL) 27-0.8 MG TABS tablet, Take 1 tablet by mouth daily at 12 noon., Disp: , Rfl:   Allergies  Allergen Reactions   Latex Rash    [facility] except for what is mentioned in HPI.  Objective:   Vitals:   10/26/21 0915  BP: 117/79  Pulse: (!) 105  Weight: 178 lb 3.2 oz (80.8 kg)    Fetal Status: Fetal Heart Rate (bpm): 157         Physical Exam: BP 117/79   Pulse (!) 105   Wt 178 lb 3.2 oz (80.8 kg)   LMP 07/22/2021 (Exact Date)   BMI 31.57 kg/m  CONSTITUTIONAL: Well-developed, well-nourished female in no acute distress.  NEUROLOGIC: Alert and oriented to person, place, and time. Normal reflexes, muscle tone coordination. No cranial nerve deficit noted. PSYCHIATRIC: Normal mood and affect. Normal behavior. Normal judgment and thought content. SKIN: Skin is warm and dry. No rash noted. Not diaphoretic. No erythema. No pallor. HENT:  Normocephalic, atraumatic, External right and left ear normal. Oropharynx is clear and moist EYES: Conjunctivae and EOM are normal.  NECK: Normal range of motion, supple, no masses CARDIOVASCULAR: Normal heart rate noted, regular rhythm RESPIRATORY: Effort and breath sounds normal, no  problems with respiration noted BREASTS: deferred ABDOMEN: Soft, nontender, nondistended, gravid. GU: normal appearing external female genitalia, multiparous normal appearing cervix, scant white discharge in vagina, no lesions noted. Pap taken with chaperone present Bimanual: 11 weeks sized uterus, no adnexal tenderness or palpable lesions noted MUSCULOSKELETAL: Normal range of motion. EXT:  No edema and no tenderness. 2+ distal pulses.   Assessment and Plan:  Pregnancy: G2P1001 at 32w6dby ultrasound  1. Supervision of other normal pregnancy, antepartum Continue routine prenatal care\  - Cytology - PAP( [place]) - Cervicovaginal ancillary only( [place]) - Culture, OB Urine - Panorama Prenatal Test Full Panel - CBC/D/Plt+RPR+Rh+ABO+RubIgG... - Babyscripts Schedule Optimization - [place] Custom - [place] OB DETAIL +14 WK; Future  2. [redacted] weeks gestation of pregnancy Monitor BP  3. History of gestational hypertension Add baseline HTN labs  - aspirin 81 MG chewable tablet; Chew 1 tablet (81 mg total) by mouth daily. Start at 14 weeks  Dispense: 30 tablet; Refill: 7  4. Nausea and vomiting during pregnancy Trial of diclegis for nausea control  - Doxylamine-Pyridoxine (DICLEGIS) 10-10 MG TBEC; Take 2 tablets by mouth at bedtime. If symptoms persist, add one tablet in the morning and one in the afternoon  Dispense: 100 tablet; Refill: 5   Preterm labor symptoms and general obstetric precautions including but not limited to vaginal bleeding, contractions, leaking of fluid and fetal movement were reviewed in detail with the patient.  Please refer to After Visit Summary for other counseling recommendations.   Return in about 4 weeks (around 11/23/2021) for ROB, in person.  LGriffin Basil8/25/2023 9:42 AM

## 2021-10-27 LAB — COMPREHENSIVE METABOLIC PANEL
ALT: 14 IU/L (ref 0–32)
AST: 15 IU/L (ref 0–40)
Albumin/Globulin Ratio: 1.4 (ref 1.2–2.2)
Albumin: 4.2 g/dL (ref 4.0–5.0)
Alkaline Phosphatase: 56 IU/L (ref 44–121)
BUN/Creatinine Ratio: 9 (ref 9–23)
BUN: 5 mg/dL — ABNORMAL LOW (ref 6–20)
Bilirubin Total: 0.4 mg/dL (ref 0.0–1.2)
CO2: 18 mmol/L — ABNORMAL LOW (ref 20–29)
Calcium: 9.6 mg/dL (ref 8.7–10.2)
Chloride: 102 mmol/L (ref 96–106)
Creatinine, Ser: 0.57 mg/dL (ref 0.57–1.00)
Globulin, Total: 2.9 g/dL (ref 1.5–4.5)
Glucose: 93 mg/dL (ref 70–99)
Potassium: 3.7 mmol/L (ref 3.5–5.2)
Sodium: 135 mmol/L (ref 134–144)
Total Protein: 7.1 g/dL (ref 6.0–8.5)
eGFR: 132 mL/min/{1.73_m2} (ref >59)

## 2021-10-27 LAB — CBC/D/PLT+RPR+RH+ABO+RUBIGG...
Antibody Screen: NEGATIVE
Basophils Absolute: 0 10*3/uL (ref 0.0–0.2)
Basos: 0 %
EOS (ABSOLUTE): 0.1 10*3/uL (ref 0.0–0.4)
Eos: 2 %
HCV Ab: NONREACTIVE
HIV Screen 4th Generation wRfx: NONREACTIVE
Hematocrit: 35.4 % (ref 34.0–46.6)
Hemoglobin: 12.3 g/dL (ref 11.1–15.9)
Hepatitis B Surface Ag: NEGATIVE
Immature Grans (Abs): 0 10*3/uL (ref 0.0–0.1)
Immature Granulocytes: 0 %
Lymphocytes Absolute: 1.7 10*3/uL (ref 0.7–3.1)
Lymphs: 23 %
MCH: 31.5 pg (ref 26.6–33.0)
MCHC: 34.7 g/dL (ref 31.5–35.7)
MCV: 91 fL (ref 79–97)
Monocytes Absolute: 0.6 10*3/uL (ref 0.1–0.9)
Monocytes: 8 %
Neutrophils Absolute: 4.9 10*3/uL (ref 1.4–7.0)
Neutrophils: 67 %
Platelets: 272 10*3/uL (ref 150–450)
RBC: 3.9 x10E6/uL (ref 3.77–5.28)
RDW: 12.6 % (ref 11.7–15.4)
RPR Ser Ql: NONREACTIVE
Rh Factor: POSITIVE
Rubella Antibodies, IGG: <0.9 index — ABNORMAL LOW (ref >0.99)
WBC: 7.4 10*3/uL (ref 3.4–10.8)

## 2021-10-27 LAB — HCV INTERPRETATION

## 2021-10-27 LAB — PROTEIN / CREATININE RATIO, URINE
Creatinine, Urine: 139 mg/dL
Protein, Ur: 12.6 mg/dL
Protein/Creat Ratio: 91 mg/g creat (ref 0–200)

## 2021-10-29 LAB — CERVICOVAGINAL ANCILLARY ONLY
Bacterial Vaginitis (gardnerella): NEGATIVE
Candida Glabrata: NEGATIVE
Candida Vaginitis: NEGATIVE
Chlamydia: NEGATIVE
Comment: NEGATIVE
Comment: NEGATIVE
Comment: NEGATIVE
Comment: NEGATIVE
Comment: NEGATIVE
Comment: NORMAL
Neisseria Gonorrhea: NEGATIVE
Trichomonas: NEGATIVE

## 2021-11-01 ENCOUNTER — Encounter: Payer: Self-pay | Admitting: Obstetrics and Gynecology

## 2021-11-01 LAB — CYTOLOGY - PAP
Comment: NEGATIVE
Diagnosis: NEGATIVE
High risk HPV: NEGATIVE

## 2021-11-06 ENCOUNTER — Encounter: Payer: Self-pay | Admitting: Obstetrics and Gynecology

## 2021-11-10 ENCOUNTER — Encounter (HOSPITAL_COMMUNITY): Payer: Self-pay | Admitting: Obstetrics and Gynecology

## 2021-11-10 ENCOUNTER — Inpatient Hospital Stay (HOSPITAL_COMMUNITY)
Admission: AD | Admit: 2021-11-10 | Discharge: 2021-11-10 | Disposition: A | Discharge status: Home / Self Care | Payer: Medicaid Other | Attending: Obstetrics and Gynecology | Admitting: Obstetrics and Gynecology

## 2021-11-10 ENCOUNTER — Other Ambulatory Visit: Payer: Self-pay

## 2021-11-10 DIAGNOSIS — O26899 Other specified pregnancy related conditions, unspecified trimester: Secondary | ICD-10-CM | POA: Insufficient documentation

## 2021-11-10 DIAGNOSIS — R109 Unspecified abdominal pain: Secondary | ICD-10-CM | POA: Diagnosis not present

## 2021-11-10 DIAGNOSIS — Z3A14 14 weeks gestation of pregnancy: Secondary | ICD-10-CM | POA: Insufficient documentation

## 2021-11-10 DIAGNOSIS — O26892 Other specified pregnancy related conditions, second trimester: Secondary | ICD-10-CM | POA: Diagnosis not present

## 2021-11-10 DIAGNOSIS — Z3492 Encounter for supervision of normal pregnancy, unspecified, second trimester: Secondary | ICD-10-CM

## 2021-11-10 LAB — WET PREP, GENITAL
Sperm: NONE SEEN
Trich, Wet Prep: NONE SEEN
WBC, Wet Prep HPF POC: <10 (ref <10)
Yeast Wet Prep HPF POC: NONE SEEN

## 2021-11-10 LAB — URINALYSIS, ROUTINE W REFLEX MICROSCOPIC
Bilirubin Urine: NEGATIVE
Glucose, UA: NEGATIVE mg/dL
Hgb urine dipstick: NEGATIVE
Ketones, ur: NEGATIVE mg/dL
Leukocytes,Ua: NEGATIVE
Nitrite: NEGATIVE
Protein, ur: NEGATIVE mg/dL
Specific Gravity, Urine: 1.004 — ABNORMAL LOW (ref 1.005–1.030)
pH: 8 (ref 5.0–8.0)

## 2021-11-10 LAB — AMNISURE RUPTURE OF MEMBRANE (ROM) NOT AT ARMC: Amnisure ROM: NEGATIVE

## 2021-11-10 MED ORDER — SIMETHICONE 80 MG PO CHEW
160.0000 mg | CHEWABLE_TABLET | Freq: Once | ORAL | Status: AC
  Administered 2021-11-10: 160 mg via ORAL
  Filled 2021-11-10: qty 2

## 2021-11-10 MED ORDER — DOXYLAMINE-PYRIDOXINE 10-10 MG PO TBEC: 1.0000 | DELAYED_RELEASE_TABLET | Freq: Every day | ORAL | 5 refills | Status: DC

## 2021-11-10 MED ORDER — METRONIDAZOLE 0.75 % VA GEL: 1.0000 | Freq: Every day | VAGINAL | 0 refills | Status: DC

## 2021-11-10 NOTE — MAU Note (Signed)
.  Paige Stevens is a 21 y.o. at [redacted]w[redacted]d here in MAU reporting: sharp pain that will start underneath her breasts and go all the way down both legs that comes and goes, "gets worse when I yawn, or walk, or stretch" also reporting lower abdominal cramping that has been on and off since last night. States "I also have the feeling to push like I have gas, but I don't have gas so I don't want to push" during the cramps. Denies VB. States she feels like she has been leaking since around 1830.. reports it feels like she is having discharge come out but when she looks, she does not see anything, but her underwear will be wet.   Onset of complaint: 0200  Pain score: 9 - sharp pain; 7 - abdominal cramping  Vitals:   11/10/21 2035  BP: 130/84  Pulse: (!) 110  Resp: 19  Temp: 98.3 F (36.8 C)  SpO2: 100%     FHT:152 Lab orders placed from triage:  UA

## 2021-11-12 LAB — GC/CHLAMYDIA PROBE AMP (~~LOC~~) NOT AT ARMC
Chlamydia: NEGATIVE
Comment: NEGATIVE
Comment: NORMAL
Neisseria Gonorrhea: NEGATIVE

## 2021-11-21 LAB — HORIZON CUSTOM: REPORT SUMMARY: NEGATIVE

## 2021-11-26 ENCOUNTER — Ambulatory Visit (INDEPENDENT_AMBULATORY_CARE_PROVIDER_SITE_OTHER): Payer: Medicaid Other | Admitting: Advanced Practice Midwife

## 2021-11-26 ENCOUNTER — Encounter: Payer: Self-pay | Admitting: Advanced Practice Midwife

## 2021-11-26 VITALS — BP 131/70 | HR 98 | Wt 172.5 lb

## 2021-11-26 DIAGNOSIS — Z23 Encounter for immunization: Secondary | ICD-10-CM | POA: Diagnosis not present

## 2021-11-26 DIAGNOSIS — R12 Heartburn: Secondary | ICD-10-CM

## 2021-11-26 DIAGNOSIS — Z3A16 16 weeks gestation of pregnancy: Secondary | ICD-10-CM | POA: Diagnosis not present

## 2021-11-26 DIAGNOSIS — O26892 Other specified pregnancy related conditions, second trimester: Secondary | ICD-10-CM

## 2021-11-26 DIAGNOSIS — O2612 Low weight gain in pregnancy, second trimester: Secondary | ICD-10-CM

## 2021-11-26 DIAGNOSIS — Z8759 Personal history of other complications of pregnancy, childbirth and the puerperium: Secondary | ICD-10-CM

## 2021-11-26 DIAGNOSIS — K30 Functional dyspepsia: Secondary | ICD-10-CM

## 2021-11-26 DIAGNOSIS — Z3480 Encounter for supervision of other normal pregnancy, unspecified trimester: Secondary | ICD-10-CM

## 2021-11-26 DIAGNOSIS — Z3482 Encounter for supervision of other normal pregnancy, second trimester: Secondary | ICD-10-CM

## 2021-11-26 MED ORDER — FAMOTIDINE 20 MG PO TABS: 20.0000 mg | ORAL_TABLET | Freq: Two times a day (BID) | ORAL | 3 refills | Status: DC

## 2021-11-26 NOTE — Progress Notes (Signed)
Pt presents for ROB visit. Pt states she is still not able to eat much and wants to know if her appetite will pick back up. Flu injection given today. No other concerns at this time.

## 2021-11-26 NOTE — Progress Notes (Signed)
   PRENATAL VISIT NOTE  Subjective:  Paige Stevens is a 21 y.o. G2P1001 at [redacted]w[redacted]d being seen today for ongoing prenatal care.  She is currently monitored for the following issues for this low-risk pregnancy and has Family history of thyroid cancer; Anemia affecting pregnancy in third trimester; Gestational hypertension, third trimester; [redacted] weeks gestation of pregnancy; Encounter for induction of labor; Supervision of other normal pregnancy, antepartum; and History of gestational hypertension on their problem list.  Patient reports  indigestion/burping, and poor weight gain/low appetite .  Contractions: Not present. Vag. Bleeding: None.  Movement: Present. Denies leaking of fluid.   The following portions of the patient's history were reviewed and updated as appropriate: allergies, current medications, past family history, past medical history, past social history, past surgical history and problem list.   Objective:   Vitals:   11/26/21 0840  BP: 131/70  Pulse: 98  Weight: 172 lb 8 oz (78.2 kg)    Fetal Status: Fetal Heart Rate (bpm): 156   Movement: Present     General:  Alert, oriented and cooperative. Patient is in no acute distress.  Skin: Skin is warm and dry. No rash noted.   Cardiovascular: Normal heart rate noted  Respiratory: Normal respiratory effort, no problems with respiration noted  Abdomen: Soft, gravid, appropriate for gestational age.  Pain/Pressure: Absent     Pelvic: Cervical exam deferred        Extremities: Normal range of motion.  Edema: None  Mental Status: Normal mood and affect. Normal behavior. Normal judgment and thought content.   Assessment and Plan:  Pregnancy: G2P1001 at [redacted]w[redacted]d 1. Supervision of other normal pregnancy, antepartum --Anticipatory guidance about next visits/weeks of pregnancy given.   - AFP, Serum, Open Spina Bifida - Enroll Patient in PreNatal Babyscripts - Babyscripts Schedule Optimization  2. History of gestational  hypertension --Normotensive today  3. [redacted] weeks gestation of pregnancy   4. Need for immunization against influenza  - Flu Vaccine QUAD 36+ mos IM (Fluarix, Quad PF)  5. Acid indigestion --Discussed dietary changes --Rx for Pepcid  6. Poor weight gain of pregnancy, second trimester --Discussed small frequent meals, liquid calories with smoothies, shakes, Ensure if needed --Pt to try to add protein/calories in next few weeks --No weight gain, pt down 10 lbs during pregnancy, should start to see weight gain now in second trimester    Preterm labor symptoms and general obstetric precautions including but not limited to vaginal bleeding, contractions, leaking of fluid and fetal movement were reviewed in detail with the patient. Please refer to After Visit Summary for other counseling recommendations.   Return in about 4 weeks (around 12/24/2021) for LOB, Any provider.  Future Appointments  Date Time Provider [place]  12/17/2021  9:30 AM [facility] NURSE [facility]  12/17/2021  9:45 AM [facility] US5 [facility]  12/24/2021 11:15 AM Leftwich-Kirby, Kathie Dike, CNM [facility] None    Fatima Blank, CNM

## 2021-11-27 ENCOUNTER — Encounter: Payer: Self-pay | Admitting: Advanced Practice Midwife

## 2021-11-28 LAB — AFP, SERUM, OPEN SPINA BIFIDA
AFP MoM: 0.83
AFP Value: 26.3 ng/mL
Gest. Age on Collection Date: 16.2 weeks
Maternal Age At EDD: 23.3 yr
OSBR Risk 1 IN: 10000
Test Results:: NEGATIVE
Weight: 172 [lb_av]

## 2021-11-28 LAB — PANORAMA PRENATAL TEST FULL PANEL:PANORAMA TEST PLUS 5 ADDITIONAL MICRODELETIONS: FETAL FRACTION: 8

## 2021-12-17 ENCOUNTER — Ambulatory Visit: Payer: Medicaid Other | Attending: Obstetrics and Gynecology

## 2021-12-17 ENCOUNTER — Encounter: Payer: Self-pay | Admitting: *Deleted

## 2021-12-17 ENCOUNTER — Other Ambulatory Visit: Payer: Self-pay | Admitting: *Deleted

## 2021-12-17 ENCOUNTER — Ambulatory Visit: Payer: Medicaid Other | Admitting: *Deleted

## 2021-12-17 VITALS — BP 123/68 | HR 88

## 2021-12-17 DIAGNOSIS — Z3A19 19 weeks gestation of pregnancy: Secondary | ICD-10-CM | POA: Insufficient documentation

## 2021-12-17 DIAGNOSIS — O09292 Supervision of pregnancy with other poor reproductive or obstetric history, second trimester: Secondary | ICD-10-CM | POA: Diagnosis not present

## 2021-12-17 DIAGNOSIS — Z3480 Encounter for supervision of other normal pregnancy, unspecified trimester: Secondary | ICD-10-CM

## 2021-12-17 DIAGNOSIS — O10912 Unspecified pre-existing hypertension complicating pregnancy, second trimester: Secondary | ICD-10-CM | POA: Diagnosis not present

## 2021-12-17 DIAGNOSIS — O99212 Obesity complicating pregnancy, second trimester: Secondary | ICD-10-CM | POA: Diagnosis not present

## 2021-12-17 DIAGNOSIS — Z362 Encounter for other antenatal screening follow-up: Secondary | ICD-10-CM

## 2021-12-17 DIAGNOSIS — Z8759 Personal history of other complications of pregnancy, childbirth and the puerperium: Secondary | ICD-10-CM

## 2021-12-17 DIAGNOSIS — Z363 Encounter for antenatal screening for malformations: Secondary | ICD-10-CM | POA: Insufficient documentation

## 2021-12-24 ENCOUNTER — Ambulatory Visit (INDEPENDENT_AMBULATORY_CARE_PROVIDER_SITE_OTHER): Payer: Medicaid Other | Admitting: Advanced Practice Midwife

## 2021-12-24 ENCOUNTER — Encounter: Payer: Self-pay | Admitting: Advanced Practice Midwife

## 2021-12-24 VITALS — BP 123/71 | HR 90 | Wt 177.2 lb

## 2021-12-24 DIAGNOSIS — Z8759 Personal history of other complications of pregnancy, childbirth and the puerperium: Secondary | ICD-10-CM

## 2021-12-24 DIAGNOSIS — Z8659 Personal history of other mental and behavioral disorders: Secondary | ICD-10-CM

## 2021-12-24 DIAGNOSIS — Z3A20 20 weeks gestation of pregnancy: Secondary | ICD-10-CM

## 2021-12-24 DIAGNOSIS — Z3480 Encounter for supervision of other normal pregnancy, unspecified trimester: Secondary | ICD-10-CM

## 2021-12-24 DIAGNOSIS — O99891 Other specified diseases and conditions complicating pregnancy: Secondary | ICD-10-CM

## 2021-12-24 NOTE — Progress Notes (Signed)
Pt presents for ROB visit. Pt states she is interest in counseling services due to previous history of PPD and rage.

## 2021-12-24 NOTE — Progress Notes (Signed)
   PRENATAL VISIT NOTE  Subjective:  Paige Stevens is a 21 y.o. G2P1001 at [redacted]w[redacted]d being seen today for ongoing prenatal care.  She is currently monitored for the following issues for this low-risk pregnancy and has Family history of thyroid cancer; Anemia affecting pregnancy in third trimester; [redacted] weeks gestation of pregnancy; Supervision of other normal pregnancy, antepartum; and History of gestational hypertension on their problem list.  Patient reports no complaints.  Contractions: Not present. Vag. Bleeding: None.  Movement: Present. Denies leaking of fluid.   The following portions of the patient's history were reviewed and updated as appropriate: allergies, current medications, past family history, past medical history, past social history, past surgical history and problem list.   Objective:   Vitals:   12/24/21 1056  BP: 123/71  Pulse: 90  Weight: 177 lb 3.2 oz (80.4 kg)    Fetal Status: Fetal Heart Rate (bpm): 172   Movement: Present     General:  Alert, oriented and cooperative. Patient is in no acute distress.  Skin: Skin is warm and dry. No rash noted.   Cardiovascular: Normal heart rate noted  Respiratory: Normal respiratory effort, no problems with respiration noted  Abdomen: Soft, gravid, appropriate for gestational age.  Pain/Pressure: Absent     Pelvic: Cervical exam deferred        Extremities: Normal range of motion.  Edema: None  Mental Status: Normal mood and affect. Normal behavior. Normal judgment and thought content.   Assessment and Plan:  Pregnancy: G2P1001 at [redacted]w[redacted]d 1. Supervision of other normal pregnancy, antepartum --Anticipatory guidance about next visits/weeks of pregnancy given.  --Pt with questions about obesity in pregnancy in her chart. Pregnancy BMI was 32. Discussed BMI as measure of health, not an accurate measure (especially for women and some ethnic groups) but it is simplest to obtain. No difficulty measuring pt fundal height in  office today. --Pt to have growth [place] for slightly elevated BMI.  She may or may not need growth [place] to continue if FH is measured in the office.  --BASA for hx HTN so no other recommendations in pregnancy for BMI at this time.   2. [redacted] weeks gestation of pregnancy   3. History of gestational hypertension   4. Hx of postpartum depression, currently pregnant --Pt had PPD and rage after her last pregnancy. She denies any symptoms now but wants to speak with a counselor and make a postpartum plan for things to go better this time.  - Ambulatory referral to [place]   Preterm labor symptoms and general obstetric precautions including but not limited to vaginal bleeding, contractions, leaking of fluid and fetal movement were reviewed in detail with the patient. Please refer to After Visit Summary for other counseling recommendations.   Return in about 4 weeks (around 01/21/2022) for LOB, Any provider.  Future Appointments  Date Time Provider [place]  01/21/2022  8:15 AM [facility] NURSE [facility]  01/21/2022  8:30 AM [facility] US2 [facility]  01/21/2022 10:35 AM Leftwich-Kirby, Kathie Dike, CNM [facility] None    Fatima Blank, CNM

## 2022-01-10 ENCOUNTER — Ambulatory Visit
Admission: RE | Admit: 2022-01-10 | Discharge: 2022-01-10 | Disposition: A | Discharge status: Home / Self Care | Payer: Medicaid Other | Source: Ambulatory Visit | Attending: Family Medicine | Admitting: Family Medicine

## 2022-01-10 VITALS — BP 112/76 | HR 90 | Temp 99.7°F | Resp 16 | Ht 63.0 in | Wt 177.2 lb

## 2022-01-10 DIAGNOSIS — J309 Allergic rhinitis, unspecified: Secondary | ICD-10-CM | POA: Diagnosis not present

## 2022-01-10 DIAGNOSIS — H6123 Impacted cerumen, bilateral: Secondary | ICD-10-CM

## 2022-01-10 DIAGNOSIS — H6692 Otitis media, unspecified, left ear: Secondary | ICD-10-CM

## 2022-01-10 MED ORDER — FEXOFENADINE HCL 180 MG PO TABS: 180.0000 mg | ORAL_TABLET | Freq: Every day | ORAL | 0 refills | Status: DC

## 2022-01-10 MED ORDER — AMOXICILLIN 875 MG PO TABS: 875.0000 mg | ORAL_TABLET | Freq: Two times a day (BID) | ORAL | 0 refills | Status: AC

## 2022-01-10 NOTE — ED Provider Notes (Signed)
[facility]    CSN: [id number] Arrival date & time: 01/10/22  0953      History   Chief Complaint Chief Complaint  Patient presents with   Otalgia    Bilateral    HPI Paige Stevens is a 21 y.o. female.   HPI 21 year old female presents with bilateral otalgia, including itchy ears and clear drainage for 2 weeks.  Patient reports using hydrogen peroxide at home.  PMH significant for HTN, anemia and anxiety.  Patient reports that she is currently [redacted] weeks pregnant 23.  Past Medical History:  Diagnosis Date   Anemia    Anxiety    Depression    Hypertension     Patient Active Problem List   Diagnosis Date Noted   History of gestational hypertension 10/26/2021   Supervision of other normal pregnancy, antepartum 09/18/2021   [redacted] weeks gestation of pregnancy 12/15/2019   Anemia affecting pregnancy in third trimester 11/23/2019   Family history of thyroid cancer 11/13/2015    Past Surgical History:  Procedure Laterality Date   NO PAST SURGERIES      OB History     Gravida  2   Para  1   Term  1   Preterm      AB      Living  1      SAB      IAB      Ectopic      Multiple  0   Live Births  1            Home Medications    Prior to Admission medications   Medication Sig Start Date End Date Taking? Authorizing Provider  amoxicillin (AMOXIL) 875 MG tablet Take 1 tablet (875 mg total) by mouth 2 (two) times daily for 10 days. 01/10/22 01/20/22 Yes Trevor Iha, FNP  fexofenadine [facility] ALLERGY) 180 MG tablet Take 1 tablet (180 mg total) by mouth daily for 15 days. 01/10/22 01/25/22 Yes Trevor Iha, FNP    Family History Family History  Problem Relation Age of Onset   Hypothyroidism Mother    Hypertension Father    Diabetes Paternal Grandmother    Hypertension Paternal Grandmother    Hypertension Paternal Grandfather     Social History Social History   Tobacco Use   Smoking status: Never   Smokeless  tobacco: Never  Vaping Use   Vaping Use: Never used  Substance Use Topics   Alcohol use: Not Currently    Comment: occ prior to pregnancy   Drug use: Never     Allergies   Latex   Review of Systems Review of Systems  HENT:  Positive for ear discharge.        Ear fullness and itchiness for 2 weeks  All other systems reviewed and are negative.    Physical Exam Triage Vital Signs ED Triage Vitals  Enc Vitals Group     BP 01/10/22 1007 112/76     Pulse Rate 01/10/22 1007 90     Resp 01/10/22 1007 16     Temp 01/10/22 1007 99.7 F (37.6 C)     Temp Source 01/10/22 1007 Oral     SpO2 01/10/22 1007 99 %     Weight 01/10/22 1012 177 lb 3.2 oz (80.4 kg)     Height 01/10/22 1012 5\' 3"  (1.6 m)     Head Circumference --      Peak Flow --      Pain Score 01/10/22 1011 4  Pain Loc --      Pain Edu? --      Excl. in GC? --    No data found.  Updated Vital Signs BP 112/76 (BP Location: Left Arm)   Pulse 90   Temp 99.7 F (37.6 C) (Oral)   Resp 16   Ht 5\' 3"  (1.6 m)   Wt 177 lb 3.2 oz (80.4 kg)   LMP 07/22/2021 (Exact Date)   SpO2 99%   BMI 31.39 kg/m      Physical Exam Vitals and nursing note reviewed.  Constitutional:      Appearance: Normal appearance. She is normal weight.  HENT:     Head: Normocephalic and atraumatic.     Right Ear: External ear normal.     Left Ear: External ear normal.     Ears:     Comments: Bilateral EACs occluded with excessive cerumen unable to visualize either TM; post bilateral ear lavage: Right EAC clear, right TM clear, retracted with serous effusions noted; left EAC clear, left TM red rimmed retracted with serous effusions noted    Mouth/Throat:     Mouth: Mucous membranes are moist.     Pharynx: Oropharynx is clear.     Comments: Moderate amount of clear drainage of posterior oropharynx noted Eyes:     Extraocular Movements: Extraocular movements intact.     Conjunctiva/sclera: Conjunctivae normal.     Pupils: Pupils are  equal, round, and reactive to light.  Cardiovascular:     Rate and Rhythm: Normal rate and regular rhythm.     Pulses: Normal pulses.     Heart sounds: Normal heart sounds.  Pulmonary:     Effort: Pulmonary effort is normal.     Breath sounds: Normal breath sounds. No wheezing, rhonchi or rales.  Musculoskeletal:        General: Normal range of motion.     Cervical back: Normal range of motion and neck supple.  Skin:    General: Skin is warm and dry.  Neurological:     General: No focal deficit present.     Mental Status: She is alert and oriented to person, place, and time.      UC Treatments / Results  Labs (all labs ordered are listed, but only abnormal results are displayed) Labs Reviewed - No data to display  EKG   Radiology No results found.  Procedures Procedures (including critical care time)  Medications Ordered in UC Medications - No data to display  Initial Impression / Assessment and Plan / UC Course  I have reviewed the triage vital signs and the nursing notes.  Pertinent labs & imaging results that were available during my care of the patient were reviewed by me and considered in my medical decision making (see chart for details).     MDM: 1.  Bilateral cerumen impaction-lateral ear lavage; 2.  Acute left otitis media-Rx'd Amoxicillin; 3.  Allergic rhinitis-Rx'd Allegra. Patient take medications as directed with food to completion.  Advised patient to take Allegra with first dose of Amoxicillin for the next 5 of 10 days.  Encouraged patient to increase daily water intake to 64 ounces per day while taking these medications.  Advised if symptoms worsen and/or unresolved please follow-up with PCP or here for further evaluation. Final Clinical Impressions(s) / UC Diagnoses   Final diagnoses:  Bilateral impacted cerumen  Acute left otitis media  Allergic rhinitis, unspecified seasonality, unspecified trigger     Discharge Instructions      Patient  take medications as directed with food to completion.  Advised patient to take Allegra with first dose of Amoxicillin for the next 5 of 10 days.  Encouraged patient to increase daily water intake to 64 ounces per day while taking these medications.  Advised if symptoms worsen and/or unresolved please follow-up with PCP or here for further evaluation.     ED Prescriptions     Medication Sig Dispense Auth. Provider   fexofenadine (ALLEGRA ALLERGY) 180 MG tablet Take 1 tablet (180 mg total) by mouth daily for 15 days. 15 tablet Trevor Iha, FNP   amoxicillin (AMOXIL) 875 MG tablet Take 1 tablet (875 mg total) by mouth 2 (two) times daily for 10 days. 20 tablet Trevor Iha, FNP      PDMP not reviewed this encounter.   Trevor Iha, FNP 01/10/22 1058

## 2022-01-10 NOTE — Discharge Instructions (Addendum)
Patient take medications as directed with food to completion.  Advised patient to take Allegra with first dose of Amoxicillin for the next 5 of 10 days.  Encouraged patient to increase daily water intake to 64 ounces per day while taking these medications.  Advised if symptoms worsen and/or unresolved please follow-up with PCP or here for further evaluation.

## 2022-01-10 NOTE — ED Triage Notes (Signed)
2 weeks of itchy ears w/ clear drainage  Sometimes it is yellow  Hydrogen peroxide at home w/ min results

## 2022-01-11 ENCOUNTER — Emergency Department (HOSPITAL_COMMUNITY)
Admission: EM | Admit: 2022-01-11 | Discharge: 2022-01-12 | Discharge status: Against Medical Advice | Payer: Medicaid Other | Attending: Emergency Medicine | Admitting: Emergency Medicine

## 2022-01-11 ENCOUNTER — Encounter (HOSPITAL_COMMUNITY): Payer: Self-pay | Admitting: Emergency Medicine

## 2022-01-11 ENCOUNTER — Other Ambulatory Visit: Payer: Self-pay

## 2022-01-11 DIAGNOSIS — O10912 Unspecified pre-existing hypertension complicating pregnancy, second trimester: Secondary | ICD-10-CM | POA: Diagnosis not present

## 2022-01-11 DIAGNOSIS — H9223 Otorrhagia, bilateral: Secondary | ICD-10-CM | POA: Insufficient documentation

## 2022-01-11 DIAGNOSIS — H60503 Unspecified acute noninfective otitis externa, bilateral: Secondary | ICD-10-CM | POA: Diagnosis not present

## 2022-01-11 DIAGNOSIS — H9203 Otalgia, bilateral: Secondary | ICD-10-CM | POA: Insufficient documentation

## 2022-01-11 DIAGNOSIS — Z3A23 23 weeks gestation of pregnancy: Secondary | ICD-10-CM | POA: Insufficient documentation

## 2022-01-11 DIAGNOSIS — O26892 Other specified pregnancy related conditions, second trimester: Secondary | ICD-10-CM | POA: Insufficient documentation

## 2022-01-11 DIAGNOSIS — Z5321 Procedure and treatment not carried out due to patient leaving prior to being seen by health care provider: Secondary | ICD-10-CM | POA: Diagnosis not present

## 2022-01-11 DIAGNOSIS — H7292 Unspecified perforation of tympanic membrane, left ear: Secondary | ICD-10-CM | POA: Diagnosis not present

## 2022-01-11 DIAGNOSIS — Z91040 Latex allergy status: Secondary | ICD-10-CM | POA: Diagnosis not present

## 2022-01-11 LAB — CBC WITH DIFFERENTIAL/PLATELET
Abs Immature Granulocytes: 0.05 10*3/uL (ref 0.00–0.07)
Basophils Absolute: 0 10*3/uL (ref 0.0–0.1)
Basophils Relative: 0 %
Eosinophils Absolute: 0.2 10*3/uL (ref 0.0–0.5)
Eosinophils Relative: 2 %
HCT: 31 % — ABNORMAL LOW (ref 36.0–46.0)
Hemoglobin: 10.7 g/dL — ABNORMAL LOW (ref 12.0–15.0)
Immature Granulocytes: 1 %
Lymphocytes Relative: 16 %
Lymphs Abs: 1.7 10*3/uL (ref 0.7–4.0)
MCH: 31.8 pg (ref 26.0–34.0)
MCHC: 34.5 g/dL (ref 30.0–36.0)
MCV: 92.3 fL (ref 80.0–100.0)
Monocytes Absolute: 1.1 10*3/uL — ABNORMAL HIGH (ref 0.1–1.0)
Monocytes Relative: 11 %
Neutro Abs: 7.5 10*3/uL (ref 1.7–7.7)
Neutrophils Relative %: 70 %
Platelets: 262 10*3/uL (ref 150–400)
RBC: 3.36 MIL/uL — ABNORMAL LOW (ref 3.87–5.11)
RDW: 12.6 % (ref 11.5–15.5)
WBC: 10.6 10*3/uL — ABNORMAL HIGH (ref 4.0–10.5)
nRBC: 0 % (ref 0.0–0.2)

## 2022-01-11 LAB — BASIC METABOLIC PANEL
Anion gap: 10 (ref 5–15)
BUN: <5 mg/dL — ABNORMAL LOW (ref 6–20)
CO2: 23 mmol/L (ref 22–32)
Calcium: 9 mg/dL (ref 8.9–10.3)
Chloride: 105 mmol/L (ref 98–111)
Creatinine, Ser: 0.47 mg/dL (ref 0.44–1.00)
GFR, Estimated: >60 mL/min (ref >60)
Glucose, Bld: 103 mg/dL — ABNORMAL HIGH (ref 70–99)
Potassium: 4 mmol/L (ref 3.5–5.1)
Sodium: 138 mmol/L (ref 135–145)

## 2022-01-11 NOTE — ED Triage Notes (Signed)
Patient reports persistent bilateral ear ache with drainage and swelling , currently taking oral antibiotic for ear infection prescribed yesterday by an urgent care .

## 2022-01-11 NOTE — ED Provider Triage Note (Signed)
Emergency Medicine Provider Triage Evaluation Note  Paige Stevens , a 21 y.o. female  was evaluated in triage.  Pt complains of b/l otalgia. Symptoms present x 3 weeks, but worsened over the past 1-2 days. States her hearing has been decreased with drainage from her b/l ear canals. She had a temp of Tmax 100F this AM. Seen at [facility] yesterday and was given an Rx for Amoxicillin which she has taken without relief. She is [redacted] weeks pregnant.  Review of Systems  Positive: As above Negative: As above  Physical Exam  BP 124/77   Pulse (!) 105   Temp 98.2 F (36.8 C) (Oral)   Resp 16   LMP 07/22/2021 (Exact Date)   SpO2 100%  Gen:   Awake, no distress   Resp:  Normal effort  MSK:   Moves extremities without difficulty  Other:  Mild swelling and redness to the external ear with TTP of the tragus and when pulling on auricle b/l. Ear canals swollen with defect of the left TM. No mastoid TTP. No meningismus. Anterior cervical lymphadenopathy  Medical Decision Making  Medically screening exam initiated at 10:49 PM.  Appropriate orders placed.  Paige Stevens was informed that the remainder of the evaluation will be completed by another provider, this initial triage assessment does not replace that evaluation, and the importance of remaining in the ED until their evaluation is complete.  Exam c/w otitis externa with likely associated otitis media. Labs ordered given concern for more complicated infectious etiology. Imaging considered, but held given pregnancy.   Antony Madura, PA-C 01/11/22 2253

## 2022-01-12 ENCOUNTER — Ambulatory Visit: Payer: Self-pay

## 2022-01-12 ENCOUNTER — Emergency Department (HOSPITAL_BASED_OUTPATIENT_CLINIC_OR_DEPARTMENT_OTHER)
Admission: EM | Admit: 2022-01-12 | Discharge: 2022-01-12 | Disposition: A | Discharge status: Home / Self Care | Payer: Medicaid Other | Source: Home / Self Care | Attending: Emergency Medicine | Admitting: Emergency Medicine

## 2022-01-12 ENCOUNTER — Other Ambulatory Visit: Payer: Self-pay

## 2022-01-12 DIAGNOSIS — H7293 Unspecified perforation of tympanic membrane, bilateral: Secondary | ICD-10-CM | POA: Insufficient documentation

## 2022-01-12 DIAGNOSIS — H6093 Unspecified otitis externa, bilateral: Secondary | ICD-10-CM | POA: Insufficient documentation

## 2022-01-12 DIAGNOSIS — O26892 Other specified pregnancy related conditions, second trimester: Secondary | ICD-10-CM | POA: Insufficient documentation

## 2022-01-12 DIAGNOSIS — Z3A23 23 weeks gestation of pregnancy: Secondary | ICD-10-CM | POA: Insufficient documentation

## 2022-01-12 DIAGNOSIS — H60503 Unspecified acute noninfective otitis externa, bilateral: Secondary | ICD-10-CM

## 2022-01-12 DIAGNOSIS — I10 Essential (primary) hypertension: Secondary | ICD-10-CM | POA: Insufficient documentation

## 2022-01-12 DIAGNOSIS — Z91040 Latex allergy status: Secondary | ICD-10-CM | POA: Insufficient documentation

## 2022-01-12 DIAGNOSIS — H7292 Unspecified perforation of tympanic membrane, left ear: Secondary | ICD-10-CM

## 2022-01-12 MED ORDER — OFLOXACIN 0.3 % OP SOLN
5.0000 [drp] | Freq: Every day | OPHTHALMIC | Status: DC
  Administered 2022-01-12: 5 [drp] via OTIC
  Filled 2022-01-12: qty 5

## 2022-01-12 NOTE — ED Triage Notes (Signed)
Pt c/o bilateral ear pain x3 weeks. Pain associated with pain radiation to jaw, increased swelling, drainage, and dizziness. Was seen a urgent care 11/8 was prescribed medications (amoxicillin) without improvement. Pt was being seen pt [facility] ED earlier today.   Per note pt is currently [redacted]wks pregnant.

## 2022-01-12 NOTE — ED Provider Notes (Signed)
[facility] EMERGENCY DEPT Provider Note   CSN: [id number] Arrival date & time: 01/12/22  0429     History  Chief Complaint  Patient presents with   Otalgia    Bilateral    Paige Stevens is a 21 y.o. female.  HPI     This is a 21 year old female who approximately [redacted] weeks pregnant who presents with bilateral ear pain.  Patient has had increasing bilateral ear pain worsening over the last week but ongoing for last 3 weeks.  It radiates into her bilateral jaws.  She was seen in urgent care on 11/8 and diagnosed with otitis media.  She was started on amoxicillin.  She states that since that time she has had left greater than right ear pain and notable drainage.  It is now painful to touch her ear.  She reports decreased hearing in the left ear.  She feels like the outside of the ear is swollen.  She also notes lymph node swelling.  Reports fever up to 100.0.  She is not diabetic.  Urgent care notes reviewed.  She did have cerumen impaction with flush irrigation.  Home Medications Prior to Admission medications   Medication Sig Start Date End Date Taking? Authorizing Provider  amoxicillin (AMOXIL) 875 MG tablet Take 1 tablet (875 mg total) by mouth 2 (two) times daily for 10 days. 01/10/22 01/20/22  Trevor Iha, FNP  fexofenadine (ALLEGRA ALLERGY) 180 MG tablet Take 1 tablet (180 mg total) by mouth daily for 15 days. 01/10/22 01/25/22  Trevor Iha, FNP      Allergies    Latex    Review of Systems   Review of Systems  HENT:  Positive for ear discharge and ear pain.   All other systems reviewed and are negative.   Physical Exam Updated Vital Signs BP 115/73 (BP Location: Right Arm)   Pulse (!) 116   Temp 98.8 F (37.1 C) (Oral)   Resp 15   LMP 07/22/2021 (Exact Date)   SpO2 99%  Physical Exam Vitals and nursing note reviewed.  Constitutional:      Appearance: She is well-developed.  HENT:     Head: Normocephalic and atraumatic.      Ears:     Comments: Left TM appears to have a perforation at 9:00, TM is red and beefy, there is debris and swelling of the external auditory canal Right TM is intact, effusion noted, slight erythema, again external auditory canal with debris and swelling No mastoid tenderness Eyes:     Pupils: Pupils are equal, round, and reactive to light.  Cardiovascular:     Rate and Rhythm: Normal rate and regular rhythm.  Pulmonary:     Effort: Pulmonary effort is normal. No respiratory distress.  Abdominal:     Palpations: Abdomen is soft.     Comments: Gravid  Musculoskeletal:     Cervical back: Neck supple.  Lymphadenopathy:     Cervical: Cervical adenopathy present.  Skin:    General: Skin is warm and dry.  Neurological:     Mental Status: She is alert and oriented to person, place, and time.  Psychiatric:        Mood and Affect: Mood normal.     ED Results / Procedures / Treatments   Labs (all labs ordered are listed, but only abnormal results are displayed) Labs Reviewed - No data to display  EKG None  Radiology No results found.  Procedures Procedures    Medications Ordered in ED Medications  ofloxacin (  OCUFLOX) 0.3 % ophthalmic solution 5 drop (5 drops Both EARS Given 01/12/22 0524)    ED Course/ Medical Decision Making/ A&P                           Medical Decision Making Risk Prescription drug management.   This patient presents to the ED for concern of bilateral ear pain, this involves an extensive number of treatment options, and is a complaint that carries with it a high risk of complications and morbidity.  I considered the following differential and admission for this acute, potentially life threatening condition.  The differential diagnosis includes otitis media, otitis externa, perforated TM, effusion, mastoiditis  MDM:    This is a 21 year old female who presents with bilateral ear pain.  Recent diagnosis of otitis media and did have flushing of the  external auditory canal to clear cerumen.  She is nontoxic.  Vital signs notable for heart rate of 116.  She has evidence of a perforated left tympanic membrane and probable otitis externa.  The right TM is intact with an effusion and slight erythema.  Again she has evidence of otitis externa.  Otitis externa is known to happen in pregnancy on occasion.  Will provide with ofloxacin drops.  There is no mastoid tenderness to suggest mastoiditis.  She systemically is well-appearing.  Recommend she continue her amoxicillin and add ofloxacin.  She was given ENT follow-up.  (Labs, imaging, consults)  Labs: I Ordered, and personally interpreted labs.  The pertinent results include: Labs reviewed from [facility] and largely reassuring.  Imaging Studies ordered: I ordered imaging studies including none I independently visualized and interpreted imaging. I agree with the radiologist interpretation  Additional history obtained from chart review.  External records from outside source obtained and reviewed including urgent care notes  Cardiac Monitoring: The patient was maintained on a cardiac monitor.  I personally viewed and interpreted the cardiac monitored which showed an underlying rhythm of: Sinus rhythm  Reevaluation: After the interventions noted above, I reevaluated the patient and found that they have :stayed the same  Social Determinants of Health:  pregnant  Disposition: Charge  Co morbidities that complicate the patient evaluation  Past Medical History:  Diagnosis Date   Anemia    Anxiety    Depression    Hypertension      Medicines Meds ordered this encounter  Medications   ofloxacin (OCUFLOX) 0.3 % ophthalmic solution 5 drop    I have reviewed the patients home medicines and have made adjustments as needed  Problem List / ED Course: Problem List Items Addressed This Visit   None Visit Diagnoses     Acute otitis externa of both ears, unspecified type    -  Primary    Perforation of left tympanic membrane                       Final Clinical Impression(s) / ED Diagnoses Final diagnoses:  Acute otitis externa of both ears, unspecified type  Perforation of left tympanic membrane    Rx / DC Orders ED Discharge Orders     None         Wilkie Aye, Mayer Masker, MD 01/12/22 [phone]

## 2022-01-12 NOTE — Discharge Instructions (Signed)
You were seen today for ongoing ear pain and ear drainage.  You have evidence of ear infection of the auditory canals bilaterally.  Your left tympanic membrane appears ruptured.  This could be secondary to infection or recent irrigation.  Instill ofloxacin drops 5 times a day bilaterally.  Continue amoxicillin.  Follow-up with ENT.

## 2022-01-21 ENCOUNTER — Ambulatory Visit: Payer: Medicaid Other | Admitting: *Deleted

## 2022-01-21 ENCOUNTER — Encounter: Payer: Self-pay | Admitting: Advanced Practice Midwife

## 2022-01-21 ENCOUNTER — Ambulatory Visit: Payer: Medicaid Other | Attending: Obstetrics and Gynecology

## 2022-01-21 ENCOUNTER — Ambulatory Visit (INDEPENDENT_AMBULATORY_CARE_PROVIDER_SITE_OTHER): Payer: Medicaid Other | Admitting: Advanced Practice Midwife

## 2022-01-21 VITALS — BP 124/67 | HR 91

## 2022-01-21 VITALS — BP 118/73 | HR 95 | Wt 180.0 lb

## 2022-01-21 DIAGNOSIS — O99212 Obesity complicating pregnancy, second trimester: Secondary | ICD-10-CM | POA: Diagnosis not present

## 2022-01-21 DIAGNOSIS — Z362 Encounter for other antenatal screening follow-up: Secondary | ICD-10-CM | POA: Diagnosis present

## 2022-01-21 DIAGNOSIS — R030 Elevated blood-pressure reading, without diagnosis of hypertension: Secondary | ICD-10-CM

## 2022-01-21 DIAGNOSIS — Z3482 Encounter for supervision of other normal pregnancy, second trimester: Secondary | ICD-10-CM

## 2022-01-21 DIAGNOSIS — Z363 Encounter for antenatal screening for malformations: Secondary | ICD-10-CM

## 2022-01-21 DIAGNOSIS — Z3A24 24 weeks gestation of pregnancy: Secondary | ICD-10-CM

## 2022-01-21 DIAGNOSIS — O09292 Supervision of pregnancy with other poor reproductive or obstetric history, second trimester: Secondary | ICD-10-CM | POA: Diagnosis not present

## 2022-01-21 DIAGNOSIS — Z8759 Personal history of other complications of pregnancy, childbirth and the puerperium: Secondary | ICD-10-CM | POA: Insufficient documentation

## 2022-01-21 DIAGNOSIS — E669 Obesity, unspecified: Secondary | ICD-10-CM

## 2022-01-21 DIAGNOSIS — Z3480 Encounter for supervision of other normal pregnancy, unspecified trimester: Secondary | ICD-10-CM

## 2022-01-21 NOTE — Progress Notes (Signed)
   PRENATAL VISIT NOTE  Subjective:  Paige Stevens is a 21 y.o. G2P1001 at [redacted]w[redacted]d being seen today for ongoing prenatal care.  She is currently monitored for the following issues for this low-risk pregnancy and has Family history of thyroid cancer; Anemia affecting pregnancy in third trimester; [redacted] weeks gestation of pregnancy; Supervision of other normal pregnancy, antepartum; and History of gestational hypertension on their problem list.  Patient reports no complaints.  Contractions: Not present. Vag. Bleeding: None.  Movement: Present. Denies leaking of fluid.   The following portions of the patient's history were reviewed and updated as appropriate: allergies, current medications, past family history, past medical history, past social history, past surgical history and problem list.   Objective:   Vitals:   01/21/22 1046  BP: 118/73  Pulse: 95  Weight: 180 lb (81.6 kg)    Fetal Status: Fetal Heart Rate (bpm): 147 Fundal Height: 24 cm Movement: Present     General:  Alert, oriented and cooperative. Patient is in no acute distress.  Skin: Skin is warm and dry. No rash noted.   Cardiovascular: Normal heart rate noted  Respiratory: Normal respiratory effort, no problems with respiration noted  Abdomen: Soft, gravid, appropriate for gestational age.  Pain/Pressure: Absent     Pelvic: Cervical exam deferred        Extremities: Normal range of motion.  Edema: None  Mental Status: Normal mood and affect. Normal behavior. Normal judgment and thought content.   Assessment and Plan:  Pregnancy: G2P1001 at [redacted]w[redacted]d 1. Supervision of other normal pregnancy, antepartum --Anticipatory guidance about next visits/weeks of pregnancy given.  --Reviewed normal [place] this morning.   2. [redacted] weeks gestation of pregnancy  3. Elevated blood pressure reading without diagnosis of hypertension --Pt reports elevated BP at recent ED visit for ear pain but BPs documented on 11/9 and 01/12/22 are  grossly normal. --Pt with elevated BP 140s/90s at home 3 days ago when she had stomach bug with vomiting (her family had it as well) but resolved when she felt better  --Normotensive today, no s/sx of PEC.  Pt taking BASA. --Pt encouraged to continue home BPs and enter them into Babyscripts. --Reviewed reasons to seek care  Preterm labor symptoms and general obstetric precautions including but not limited to vaginal bleeding, contractions, leaking of fluid and fetal movement were reviewed in detail with the patient. Please refer to After Visit Summary for other counseling recommendations.   Return in about 4 weeks (around 02/18/2022) for GTT at next visit, LOB, Any provider.  Future Appointments  Date Time Provider Department Center  02/18/2022  8:45 AM [facility] None  02/18/2022 11:15 AM Warden Fillers, MD [facility] None    Sharen Counter, CNM

## 2022-01-21 NOTE — Progress Notes (Signed)
Pt presents for ROB visit. No concerns at this time.  

## 2022-02-18 ENCOUNTER — Other Ambulatory Visit: Payer: Medicaid Other

## 2022-02-18 ENCOUNTER — Ambulatory Visit (INDEPENDENT_AMBULATORY_CARE_PROVIDER_SITE_OTHER): Payer: Medicaid Other | Admitting: Obstetrics and Gynecology

## 2022-02-18 VITALS — BP 122/76 | HR 98 | Wt 185.0 lb

## 2022-02-18 DIAGNOSIS — Z3483 Encounter for supervision of other normal pregnancy, third trimester: Secondary | ICD-10-CM

## 2022-02-18 DIAGNOSIS — Z3A28 28 weeks gestation of pregnancy: Secondary | ICD-10-CM | POA: Diagnosis not present

## 2022-02-18 DIAGNOSIS — Z23 Encounter for immunization: Secondary | ICD-10-CM | POA: Diagnosis not present

## 2022-02-18 DIAGNOSIS — Z8759 Personal history of other complications of pregnancy, childbirth and the puerperium: Secondary | ICD-10-CM

## 2022-02-18 DIAGNOSIS — O99013 Anemia complicating pregnancy, third trimester: Secondary | ICD-10-CM

## 2022-02-18 DIAGNOSIS — Z3480 Encounter for supervision of other normal pregnancy, unspecified trimester: Secondary | ICD-10-CM

## 2022-02-18 NOTE — Progress Notes (Signed)
ROB/GTT/TDAP.  TDAP given in LD, tolerated well.

## 2022-02-18 NOTE — Progress Notes (Signed)
   PRENATAL VISIT NOTE  Subjective:  Paige Stevens is a 21 y.o. G2P1001 at [redacted]w[redacted]d being seen today for ongoing prenatal care.  She is currently monitored for the following issues for this high-risk pregnancy and has Family history of thyroid cancer; Anemia affecting pregnancy in third trimester; [redacted] weeks gestation of pregnancy; Supervision of other normal pregnancy, antepartum; and History of gestational hypertension on their problem list.  Patient doing well with no acute concerns today. She reports no complaints.  Contractions: Not present. Vag. Bleeding: None.  Movement: Present. Denies leaking of fluid.   The following portions of the patient's history were reviewed and updated as appropriate: allergies, current medications, past family history, past medical history, past social history, past surgical history and problem list. Problem list updated.  Objective:   Vitals:   02/18/22 1013  BP: 122/76  Pulse: 98  Weight: 185 lb (83.9 kg)    Fetal Status: Fetal Heart Rate (bpm): 142 Fundal Height: 28 cm Movement: Present     General:  Alert, oriented and cooperative. Patient is in no acute distress.  Skin: Skin is warm and dry. No rash noted.   Cardiovascular: Normal heart rate noted  Respiratory: Normal respiratory effort, no problems with respiration noted  Abdomen: Soft, gravid, appropriate for gestational age.  Pain/Pressure: Present     Pelvic: Cervical exam deferred        Extremities: Normal range of motion.  Edema: None  Mental Status:  Normal mood and affect. Normal behavior. Normal judgment and thought content.   Assessment and Plan:  Pregnancy: G2P1001 at [redacted]w[redacted]d  1. Supervision of other normal pregnancy, antepartum Continue routine prenatal care Info given on nexplanon  - Glucose Tolerance, 2 Hours w/1 Hour - RPR - CBC - HIV antibody (with reflex) - Tdap vaccine greater than or equal to 7yo IM  2. [redacted] weeks gestation of pregnancy   3. History of  gestational hypertension BP currently WNL  4. Anemia affecting pregnancy in third trimester Will recheck h/h today  Preterm labor symptoms and general obstetric precautions including but not limited to vaginal bleeding, contractions, leaking of fluid and fetal movement were reviewed in detail with the patient.  Please refer to After Visit Summary for other counseling recommendations.   Return in about 2 weeks (around 03/04/2022) for [facility], in person.   Mariel Aloe, MD Faculty Attending Center for [facility]

## 2022-02-19 LAB — CBC
Hematocrit: 31.3 % — ABNORMAL LOW (ref 34.0–46.6)
Hemoglobin: 10.6 g/dL — ABNORMAL LOW (ref 11.1–15.9)
MCH: 30.5 pg (ref 26.6–33.0)
MCHC: 33.9 g/dL (ref 31.5–35.7)
MCV: 90 fL (ref 79–97)
Platelets: 285 10*3/uL (ref 150–450)
RBC: 3.48 x10E6/uL — ABNORMAL LOW (ref 3.77–5.28)
RDW: 12 % (ref 11.7–15.4)
WBC: 8.1 10*3/uL (ref 3.4–10.8)

## 2022-02-19 LAB — RPR: RPR Ser Ql: NONREACTIVE

## 2022-02-19 LAB — GLUCOSE TOLERANCE, 2 HOURS W/ 1HR
Glucose, 1 hour: 128 mg/dL (ref 70–179)
Glucose, 2 hour: 104 mg/dL (ref 70–152)
Glucose, Fasting: 79 mg/dL (ref 70–91)

## 2022-02-19 LAB — HIV ANTIBODY (ROUTINE TESTING W REFLEX): HIV Screen 4th Generation wRfx: NONREACTIVE

## 2022-02-28 ENCOUNTER — Encounter (HOSPITAL_COMMUNITY): Payer: Self-pay | Admitting: Obstetrics and Gynecology

## 2022-02-28 ENCOUNTER — Inpatient Hospital Stay (HOSPITAL_COMMUNITY)
Admission: AD | Admit: 2022-02-28 | Discharge: 2022-02-28 | Disposition: A | Discharge status: Home / Self Care | Payer: Medicaid Other | Attending: Obstetrics and Gynecology | Admitting: Obstetrics and Gynecology

## 2022-02-28 ENCOUNTER — Telehealth: Payer: Self-pay | Admitting: Emergency Medicine

## 2022-02-28 DIAGNOSIS — W108XXA Fall (on) (from) other stairs and steps, initial encounter: Secondary | ICD-10-CM | POA: Insufficient documentation

## 2022-02-28 DIAGNOSIS — W109XXA Fall (on) (from) unspecified stairs and steps, initial encounter: Secondary | ICD-10-CM | POA: Diagnosis not present

## 2022-02-28 DIAGNOSIS — Z3A29 29 weeks gestation of pregnancy: Secondary | ICD-10-CM | POA: Diagnosis not present

## 2022-02-28 DIAGNOSIS — R102 Pelvic and perineal pain: Secondary | ICD-10-CM | POA: Diagnosis present

## 2022-02-28 DIAGNOSIS — O9A213 Injury, poisoning and certain other consequences of external causes complicating pregnancy, third trimester: Secondary | ICD-10-CM | POA: Insufficient documentation

## 2022-02-28 NOTE — Telephone Encounter (Signed)
Attempted RC to patient. LVM 

## 2022-02-28 NOTE — MAU Note (Signed)
.  Paige Stevens is a 21 y.o. at [redacted]w[redacted]d here in MAU reporting: with complaint of falling when she slipped on her stairs around 0730.  Pt states that she did not hit her stomach and could not remember how she feel.  Denies VB and LOF.  +FM LMP: 07/22/21 Onset of complaint: 0730 Pain score: 9 when is walking  Vitals:   02/28/22 1144  BP: 115/69  Pulse: (!) 103  Resp: 17  Temp: 98.8 F (37.1 C)  SpO2: 99%     FHT:140 Lab orders placed from triage:    none

## 2022-02-28 NOTE — Discharge Instructions (Signed)
PREGNANCY SUPPORT BELT: You are not alone, Seventy-five percent of women have some sort of abdominal or back pain at some point in their pregnancy. Your baby is growing at a fast pace, which means that your whole body is rapidly trying to adjust to the changes. As your uterus grows, your back may start feeling a bit under stress and this can result in back or abdominal pain that can go from mild, and therefore bearable, to severe pains that will not allow you to sit or lay down comfortably, When it comes to dealing with pregnancy-related pains and cramps, some pregnant women usually prefer natural remedies, which the market is filled with nowadays. For example, wearing a pregnancy support belt can help ease and lessen your discomfort and pain. WHAT ARE THE BENEFITS OF WEARING A PREGNANCY SUPPORT BELT? A pregnancy support belt provides support to the lower portion of the belly taking some of the weight of the growing uterus and distributing to the other parts of your body. It is designed make you comfortable and gives you extra support. Over the years, the pregnancy apparel market has been studying the needs and wants of pregnant women and they have come up with the most comfortable pregnancy support belts that woman could ever ask for. In fact, you will no longer have to wear a stretched-out or bulky pregnancy belt that is visible underneath your clothes and makes you feel even more uncomfortable. Nowadays, a pregnancy support belt is made of comfortable and stretchy materials that will not irritate your skin but will actually make you feel at ease and you will not even notice you are wearing it. They are easy to put on and adjust during the day and can be worn at night for additional support.  BENEFITS: Relives Back pain Relieves Abdominal Muscle and Leg Pain Stabilizes the Pelvic Ring Offers a Cushioned Abdominal Lift Pad Relieves pressure on the Sciatic Nerve Within Minutes    Locations that Carry  Maternity/Pregnancy Support Belts  You can find belts on [employer].com starting at $10-$15.  2.  The [facility] carries belts for $10-$15.        Address/Phone: [address], [place], [place] [address], [phone]  3.  You may be able to file your insurance with [email or web address] and have a belt mailed to you.  If you have any problems getting the belt, let your Center for [facility] office know.     Safe Medications in Pregnancy   Acne: Benzoyl Peroxide Salicylic Acid  Backache/Headache: Tylenol: 2 regular strength every 4 hours OR              2 Extra strength every 6 hours  Colds/Coughs/Allergies: Benadryl (alcohol free) 25 mg every 6 hours as needed Breath right strips Claritin Cepacol throat lozenges Chloraseptic throat spray Cold-Eeze- up to three times per day Cough drops, alcohol free Flonase (by prescription only) Guaifenesin Mucinex Robitussin DM (plain only, alcohol free) Saline nasal spray/drops Sudafed (pseudoephedrine) & Actifed ** use only after [redacted] weeks gestation and if you do not have high blood pressure Tylenol Vicks Vaporub Zinc lozenges Zyrtec   Constipation: Colace Ducolax suppositories Fleet enema Glycerin suppositories Metamucil Milk of magnesia Miralax Senokot Smooth move tea  Diarrhea: Kaopectate Imodium A-D  *NO pepto Bismol  Hemorrhoids: Anusol Anusol HC Preparation H Tucks  Indigestion: Tums Maalox Mylanta Zantac  Pepcid  Insomnia: Benadryl (alcohol free) 25mg  every 6 hours as needed Tylenol PM Unisom, no Gelcaps  Leg Cramps: Tums  MagGel  Nausea/Vomiting:  Bonine Dramamine Emetrol Ginger extract Sea bands Meclizine  Nausea medication to take during pregnancy:  Unisom (doxylamine succinate 25 mg tablets) Take one tablet daily at bedtime. If symptoms are not adequately controlled, the dose can be increased to a maximum recommended dose of two tablets  daily (1/2 tablet in the morning, 1/2 tablet mid-afternoon and one at bedtime). Vitamin B6 100mg  tablets. Take one tablet twice a day (up to 200 mg per day).  Skin Rashes: Aveeno products Benadryl cream or 25mg  every 6 hours as needed Calamine Lotion 1% cortisone cream  Yeast infection: Gyne-lotrimin 7 Monistat 7  Gum/tooth pain: Anbesol  **If taking multiple medications, please check labels to avoid duplicating the same active ingredients **take medication as directed on the label ** Do not exceed 4000 mg of tylenol in 24 hours **Do not take medications that contain aspirin or ibuprofen

## 2022-02-28 NOTE — MAU Provider Note (Signed)
History     [id number]  Arrival date and time: 02/28/22 1102    Chief Complaint  Patient presents with   Fall     HPI Genesy Minch is a 21 y.o. at [redacted]w[redacted]d who presents for evaluation after a fall. Incident occurred at 730 am this morning. States she is unsure how she fell by landed on her bottom & slid down 3 steps. Did not hit her head or her abdomen. Since then has had sharp pain in her pelvis when she walks & changes position.  Denies abdominal pain, vaginal bleeding, or LOF. Reports good fetal movement. Took 1 gm of tylenol prior to arrival.    OB History     Gravida  2   Para  1   Term  1   Preterm      AB      Living  1      SAB      IAB      Ectopic      Multiple  0   Live Births  1           Past Medical History:  Diagnosis Date   Anemia    Anxiety    Depression    Hypertension     Past Surgical History:  Procedure Laterality Date   NO PAST SURGERIES      Family History  Problem Relation Age of Onset   Hypothyroidism Mother    Hypertension Father    Diabetes Paternal Grandmother    Hypertension Paternal Grandmother    Hypertension Paternal Grandfather     Allergies  Allergen Reactions   Latex Rash    Per prenatal records from [place]    No current facility-administered medications on file prior to encounter.   Current Outpatient Medications on File Prior to Encounter  Medication Sig Dispense Refill   Prenatal Vit-Fe Fumarate-FA (PREPLUS) 27-1 MG TABS Take by mouth.     fexofenadine (ALLEGRA ALLERGY) 180 MG tablet Take 1 tablet (180 mg total) by mouth daily for 15 days. 15 tablet 0     ROS Pertinent positives and negative per HPI, all others reviewed and negative  Physical Exam   BP 113/70 (BP Location: Right Arm)   Pulse (!) 102   Temp 97.9 F (36.6 C) (Oral)   Resp 18   Ht 5\' 3"  (1.6 m)   Wt 84.8 kg   LMP 07/22/2021 (Exact Date)   SpO2 98%   BMI 33.13 kg/m   Patient Vitals for the past 24 hrs:   BP Temp Temp src Pulse Resp SpO2 Height Weight  02/28/22 1154 113/70 97.9 F (36.6 C) Oral (!) 102 18 98 % -- --  02/28/22 1144 115/69 98.8 F (37.1 C) Oral (!) 103 17 99 % 5\' 3"  (1.6 m) 84.8 kg    Physical Exam Vitals and nursing note reviewed.  Constitutional:      General: She is not in acute distress.    Appearance: Normal appearance.  HENT:     Head: Normocephalic and atraumatic.  Eyes:     General: No scleral icterus. Pulmonary:     Effort: Pulmonary effort is normal. No respiratory distress.  Abdominal:     Palpations: Abdomen is soft.     Tenderness: There is no abdominal tenderness. There is no guarding or rebound.     Comments: Gravid. No bruising.   Skin:    General: Skin is warm and dry.  Neurological:     Mental Status: She is  alert.  Psychiatric:        Mood and Affect: Mood normal.        Behavior: Behavior normal.       FHT Baseline 140, moderate variability, 15x15 accels, no decels Toco: none Cat: 1  Labs No results found for this or any previous visit (from the past 24 hour(s)).  Imaging No results found.  MAU Course  Procedures Lab Orders  No laboratory test(s) ordered today   No orders of the defined types were placed in this encounter.  Imaging Orders  No imaging studies ordered today    MDM Prolonged monitoring for fall. Reactive fetal tracing & no contractions.  Declined medication in MAU Assessment and Plan   1. Fall on stairs, initial encounter   2. [redacted] weeks gestation of pregnancy    -Reviewed return precautions -Recommend maternity support belt  Judeth Horn, NP 02/28/22 1:15 PM

## 2022-03-04 NOTE — L&D Delivery Note (Addendum)
LABOR COURSE Patient arrived 5cm dilated. She progressed to complete without augmentation.  Delivery Note Called to room and patient was complete and pushing. Head delivered OA and restituted to LOA. No nuchal cord present. Shoulder and body delivered in usual fashion. At 1036 a viable and healthy female was delivered via Vaginal, Spontaneous (Presentation: OA).  Infant with spontaneous cry, placed on mother's abdomen, dried and stimulated. Cord clamped x 2 after 1-minute delay, and cut by [name]. Cord blood drawn. Placenta delivered spontaneously with gentle cord traction. Appears intact. Fundus firm with massage and Pitocin. Labia, perineum, vagina, and cervix inspected without laceration.    APGAR: 7,9 ; weight pending.   Cord: 3VC without complication  Anesthesia:  Epidural Episiotomy: None Lacerations: none Suture Repair:  n/a Est. Blood Loss (mL): 425cc  Mom to postpartum.  Baby to Couplet care / Skin to Skin.  Darlen Round, MD PGY-1 05/09/2022 11:20 AM     Fellow Attestation  I was present and gloved for this delivery and agree with the above documentation in the resident's note.  Stormy Card, MD/[facility] Center for [employer] (Faculty Practice) 05/09/2022, 4:34 PM

## 2022-03-05 ENCOUNTER — Encounter: Payer: Medicaid Other | Admitting: Obstetrics & Gynecology

## 2022-03-05 ENCOUNTER — Ambulatory Visit (INDEPENDENT_AMBULATORY_CARE_PROVIDER_SITE_OTHER): Payer: Medicaid Other | Admitting: Obstetrics & Gynecology

## 2022-03-05 ENCOUNTER — Encounter: Payer: Medicaid Other | Admitting: Licensed Clinical Social Worker

## 2022-03-05 VITALS — BP 126/80 | HR 92 | Wt 185.0 lb

## 2022-03-05 DIAGNOSIS — Z3483 Encounter for supervision of other normal pregnancy, third trimester: Secondary | ICD-10-CM

## 2022-03-05 DIAGNOSIS — Z3A30 30 weeks gestation of pregnancy: Secondary | ICD-10-CM

## 2022-03-05 DIAGNOSIS — Z808 Family history of malignant neoplasm of other organs or systems: Secondary | ICD-10-CM

## 2022-03-05 DIAGNOSIS — Z3480 Encounter for supervision of other normal pregnancy, unspecified trimester: Secondary | ICD-10-CM

## 2022-03-05 DIAGNOSIS — Z8759 Personal history of other complications of pregnancy, childbirth and the puerperium: Secondary | ICD-10-CM

## 2022-03-05 MED ORDER — FERROUS SULFATE 325 (65 FE) MG PO TABS: 325.0000 mg | ORAL_TABLET | ORAL | 3 refills | Status: DC

## 2022-03-05 NOTE — Progress Notes (Unsigned)
   PRENATAL VISIT NOTE  Subjective:  Paige Stevens is a 22 y.o. G2P1001 at [redacted]w[redacted]d being seen today for ongoing prenatal care.  She is currently monitored for the following issues for this low-risk pregnancy and has Family history of thyroid cancer; Supervision of other normal pregnancy, antepartum; and History of gestational hypertension on their problem list.  Patient reports no complaints.  Contractions: Not present. Vag. Bleeding: None.  Movement: Present. Denies leaking of fluid.   The following portions of the patient's history were reviewed and updated as appropriate: allergies, current medications, past family history, past medical history, past social history, past surgical history and problem list.   Objective:   Vitals:   03/05/22 0904  BP: 126/80  Pulse: 92  Weight: 185 lb (83.9 kg)    Fetal Status: Fetal Heart Rate (bpm): 153   Movement: Present     General:  Alert, oriented and cooperative. Patient is in no acute distress.  Skin: Skin is warm and dry. No rash noted.   Cardiovascular: Normal heart rate noted  Respiratory: Normal respiratory effort, no problems with respiration noted  Abdomen: Soft, gravid, appropriate for gestational age.  Pain/Pressure: Present     Pelvic: Cervical exam deferred        Extremities: Normal range of motion.  Edema: None  Mental Status: Normal mood and affect. Normal behavior. Normal judgment and thought content.h/o am   Assessment and Plan:  Pregnancy: G2P1001 at [redacted]w[redacted]d 1. Supervision of other normal pregnancy, antepartum H/o anemia - ferrous sulfate 325 (65 FE) MG tablet; Take 1 tablet (325 mg total) by mouth every other day.  Dispense: 30 tablet; Refill: 3  2. Family history of thyroid cancer   3. History of gestational hypertension  126/80 Preterm labor symptoms and general obstetric precautions including but not limited to vaginal bleeding, contractions, leaking of fluid and fetal movement were reviewed in detail  with the patient. Please refer to After Visit Summary for other counseling recommendations.   Return in about 2 weeks (around 03/19/2022).  Future Appointments  Date Time Provider [place]  03/19/2022  9:35 AM Shelly Bombard, MD [place] None  04/02/2022  9:55 AM Griffin Basil, MD [place] None  04/16/2022  9:35 AM Shelly Bombard, MD [place] None  04/23/2022  9:35 AM Shelly Bombard, MD [place] None  04/30/2022  9:55 AM Woodroe Mode, MD [facility] None    Emeterio Reeve, MD

## 2022-03-07 ENCOUNTER — Ambulatory Visit (INDEPENDENT_AMBULATORY_CARE_PROVIDER_SITE_OTHER): Payer: Medicaid Other

## 2022-03-07 VITALS — BP 117/78 | HR 90

## 2022-03-07 DIAGNOSIS — R300 Dysuria: Secondary | ICD-10-CM

## 2022-03-07 LAB — POCT URINALYSIS DIPSTICK
Bilirubin, UA: NEGATIVE
Blood, UA: NEGATIVE
Glucose, UA: NEGATIVE
Ketones, UA: NEGATIVE
Nitrite, UA: NEGATIVE
Protein, UA: NEGATIVE
Spec Grav, UA: 1.015 (ref 1.010–1.025)
Urobilinogen, UA: 0.2 E.U./dL
pH, UA: 5 (ref 5.0–8.0)

## 2022-03-07 MED ORDER — NITROFURANTOIN MONOHYD MACRO 100 MG PO CAPS: 100.0000 mg | ORAL_CAPSULE | Freq: Two times a day (BID) | ORAL | 0 refills | Status: DC

## 2022-03-07 NOTE — Progress Notes (Signed)
..  SUBJECTIVE: Paige Stevens is a 22 y.o. female who complains of urinary dysuria x 1 week, without flank pain, fever, chills, or abnormal vaginal discharge or bleeding. Pt reports good fetal movement today.   OBJECTIVE: Appears well, in no apparent distress.  BP 117/78, pulse 90. Urine dipstick shows positive for leukocytes.    ASSESSMENT: Dysuria  PLAN: Treatment per orders.   Rx sent per protocol, urine culture sent to lab Call or return to clinic prn if these symptoms worsen or fail to improve as anticipated.

## 2022-03-11 ENCOUNTER — Other Ambulatory Visit: Payer: Self-pay | Admitting: Emergency Medicine

## 2022-03-11 DIAGNOSIS — O2343 Unspecified infection of urinary tract in pregnancy, third trimester: Secondary | ICD-10-CM

## 2022-03-11 LAB — URINE CULTURE, OB REFLEX

## 2022-03-11 LAB — CULTURE, OB URINE

## 2022-03-11 MED ORDER — AMPICILLIN 500 MG PO CAPS: 500.0000 mg | ORAL_CAPSULE | Freq: Three times a day (TID) | ORAL | 0 refills | Status: DC

## 2022-03-11 NOTE — Progress Notes (Signed)
Rx for UTI per Dr. Rip Harbour.

## 2022-03-19 ENCOUNTER — Ambulatory Visit (INDEPENDENT_AMBULATORY_CARE_PROVIDER_SITE_OTHER): Payer: Medicaid Other | Admitting: Obstetrics

## 2022-03-19 ENCOUNTER — Encounter: Payer: Self-pay | Admitting: Obstetrics

## 2022-03-19 ENCOUNTER — Encounter: Payer: Medicaid Other | Admitting: Obstetrics and Gynecology

## 2022-03-19 VITALS — BP 130/78 | HR 92 | Wt 188.2 lb

## 2022-03-19 DIAGNOSIS — Z8759 Personal history of other complications of pregnancy, childbirth and the puerperium: Secondary | ICD-10-CM

## 2022-03-19 DIAGNOSIS — Z808 Family history of malignant neoplasm of other organs or systems: Secondary | ICD-10-CM

## 2022-03-19 DIAGNOSIS — O99013 Anemia complicating pregnancy, third trimester: Secondary | ICD-10-CM

## 2022-03-19 DIAGNOSIS — Z3483 Encounter for supervision of other normal pregnancy, third trimester: Secondary | ICD-10-CM

## 2022-03-19 DIAGNOSIS — Z3A32 32 weeks gestation of pregnancy: Secondary | ICD-10-CM

## 2022-03-19 DIAGNOSIS — Z3480 Encounter for supervision of other normal pregnancy, unspecified trimester: Secondary | ICD-10-CM

## 2022-03-19 DIAGNOSIS — M62838 Other muscle spasm: Secondary | ICD-10-CM

## 2022-03-19 MED ORDER — CYCLOBENZAPRINE HCL 10 MG PO TABS: 10.0000 mg | ORAL_TABLET | Freq: Three times a day (TID) | ORAL | 1 refills | Status: DC | PRN

## 2022-03-19 NOTE — Progress Notes (Signed)
Pt presents for ROB visit. Pt c/o of pain in the right groin since she slipped down the stairs 2 weeks ago. Requesting treatment options.

## 2022-03-19 NOTE — Progress Notes (Signed)
Subjective:  Paige Stevens is a 22 y.o. G2P1001 at [redacted]w[redacted]d being seen today for ongoing prenatal care.  She is currently monitored for the following issues for this low-risk pregnancy and has Family history of thyroid cancer; Supervision of other normal pregnancy, antepartum; and History of gestational hypertension on their problem list.  Patient reports  pain in right groin area after a fall. .   .  .  Movement: Present. Denies leaking of fluid.   The following portions of the patient's history were reviewed and updated as appropriate: allergies, current medications, past family history, past medical history, past social history, past surgical history and problem list. Problem list updated.  Objective:   Vitals:   03/19/22 1007  BP: 130/78  Pulse: 92  Weight: 188 lb 3.2 oz (85.4 kg)    Fetal Status: Fetal Heart Rate (bpm): 150   Movement: Present     General:  Alert, oriented and cooperative. Patient is in no acute distress.  Skin: Skin is warm and dry. No rash noted.   Cardiovascular: Normal heart rate noted  Respiratory: Normal respiratory effort, no problems with respiration noted  Abdomen: Soft, gravid, appropriate for gestational age.       Pelvic:  Cervical exam deferred        Extremities: Normal range of motion.  Edema: Trace  Mental Status: Normal mood and affect. Normal behavior. Normal judgment and thought content.   Urinalysis:      Assessment and Plan:  Pregnancy: G2P1001 at [redacted]w[redacted]d  1. Supervision of other normal pregnancy, antepartum  2. History of gestational hypertension - BP is stable  3. Family history of thyroid cancer  4. Muscle spasm Rx: - cyclobenzaprine (FLEXERIL) 10 MG tablet; Take 1 tablet (10 mg total) by mouth every 8 (eight) hours as needed for muscle spasms.  Dispense: 30 tablet; Refill: 1  5. Anemia affecting pregnancy in third trimester - taking iron -   Preterm labor symptoms and general obstetric precautions including but not  limited to vaginal bleeding, contractions, leaking of fluid and fetal movement were reviewed in detail with the patient. Please refer to After Visit Summary for other counseling recommendations.   Return in about 2 weeks (around 04/02/2022) for ROB.   Shelly Bombard, MD 03/19/2022

## 2022-04-02 ENCOUNTER — Ambulatory Visit (INDEPENDENT_AMBULATORY_CARE_PROVIDER_SITE_OTHER): Payer: Medicaid Other | Admitting: Obstetrics and Gynecology

## 2022-04-02 ENCOUNTER — Encounter: Payer: Medicaid Other | Admitting: Obstetrics and Gynecology

## 2022-04-02 VITALS — BP 126/77 | HR 105 | Wt 191.0 lb

## 2022-04-02 DIAGNOSIS — Z3480 Encounter for supervision of other normal pregnancy, unspecified trimester: Secondary | ICD-10-CM

## 2022-04-02 DIAGNOSIS — Z3A34 34 weeks gestation of pregnancy: Secondary | ICD-10-CM

## 2022-04-02 DIAGNOSIS — Z3483 Encounter for supervision of other normal pregnancy, third trimester: Secondary | ICD-10-CM

## 2022-04-02 DIAGNOSIS — Z8759 Personal history of other complications of pregnancy, childbirth and the puerperium: Secondary | ICD-10-CM

## 2022-04-02 NOTE — Progress Notes (Signed)
   PRENATAL VISIT NOTE  Subjective:  Paige Stevens is a 22 y.o. G2P1001 at 102w3d being seen today for ongoing prenatal care.  She is currently monitored for the following issues for this low-risk pregnancy and has Family history of thyroid cancer; Supervision of other normal pregnancy, antepartum; and History of gestational hypertension on their problem list.  Patient doing well with no acute concerns today. She reports no complaints.  Contractions: Not present. Vag. Bleeding: None.  Movement: Present. Denies leaking of fluid.   The following portions of the patient's history were reviewed and updated as appropriate: allergies, current medications, past family history, past medical history, past social history, past surgical history and problem list. Problem list updated.  Objective:   Vitals:   04/02/22 1004  BP: 126/77  Pulse: (!) 105  Weight: 191 lb (86.6 kg)    Fetal Status: Fetal Heart Rate (bpm): 145 Fundal Height: 34 cm Movement: Present     General:  Alert, oriented and cooperative. Patient is in no acute distress.  Skin: Skin is warm and dry. No rash noted.   Cardiovascular: Normal heart rate noted  Respiratory: Normal respiratory effort, no problems with respiration noted  Abdomen: Soft, gravid, appropriate for gestational age.  Pain/Pressure: Absent     Pelvic: Cervical exam deferred        Extremities: Normal range of motion.     Mental Status:  Normal mood and affect. Normal behavior. Normal judgment and thought content.   Assessment and Plan:  Pregnancy: G2P1001 at [redacted]w[redacted]d  1. [redacted] weeks gestation of pregnancy   2. Supervision of other normal pregnancy, antepartum Continue routine prenatal care, swabs at next visit  3. History of gestational hypertension Normal BP today  Preterm labor symptoms and general obstetric precautions including but not limited to vaginal bleeding, contractions, leaking of fluid and fetal movement were reviewed in detail with  the patient.  Please refer to After Visit Summary for other counseling recommendations.   Return in about 2 weeks (around 04/16/2022) for ROB, in person, 36 weeks swabs.   Lynnda Shields, MD Faculty Attending [facility]

## 2022-04-02 NOTE — Progress Notes (Signed)
Pt is doing well with no concerns today.

## 2022-04-16 ENCOUNTER — Encounter: Payer: Medicaid Other | Admitting: Obstetrics & Gynecology

## 2022-04-16 ENCOUNTER — Ambulatory Visit (INDEPENDENT_AMBULATORY_CARE_PROVIDER_SITE_OTHER): Payer: Medicaid Other | Admitting: Obstetrics and Gynecology

## 2022-04-16 ENCOUNTER — Other Ambulatory Visit (HOSPITAL_COMMUNITY)
Admission: RE | Admit: 2022-04-16 | Discharge: 2022-04-16 | Disposition: A | Discharge status: Home / Self Care | Payer: Medicaid Other | Source: Ambulatory Visit | Attending: Obstetrics and Gynecology | Admitting: Obstetrics and Gynecology

## 2022-04-16 ENCOUNTER — Encounter: Payer: Self-pay | Admitting: Obstetrics and Gynecology

## 2022-04-16 VITALS — BP 134/81 | HR 94 | Wt 190.4 lb

## 2022-04-16 DIAGNOSIS — Z8759 Personal history of other complications of pregnancy, childbirth and the puerperium: Secondary | ICD-10-CM

## 2022-04-16 DIAGNOSIS — Z3480 Encounter for supervision of other normal pregnancy, unspecified trimester: Secondary | ICD-10-CM | POA: Insufficient documentation

## 2022-04-16 DIAGNOSIS — Z3483 Encounter for supervision of other normal pregnancy, third trimester: Secondary | ICD-10-CM

## 2022-04-16 DIAGNOSIS — Z3A36 36 weeks gestation of pregnancy: Secondary | ICD-10-CM

## 2022-04-16 NOTE — Progress Notes (Signed)
Patient presents for ROB and GBS. Patient has no concerns today.

## 2022-04-16 NOTE — Progress Notes (Signed)
   PRENATAL VISIT NOTE  Subjective:  Paige Stevens is a 22 y.o. G2P1001 at [redacted]w[redacted]d being seen today for ongoing prenatal care.  She is currently monitored for the following issues for this low-risk pregnancy and has Family history of thyroid cancer; Supervision of other normal pregnancy, antepartum; and History of gestational hypertension on their problem list.  Patient reports no complaints.  Contractions: Irritability. Vag. Bleeding: None.  Movement: Present. Denies leaking of fluid.   The following portions of the patient's history were reviewed and updated as appropriate: allergies, current medications, past family history, past medical history, past social history, past surgical history and problem list.   Objective:   Vitals:   04/16/22 0940  BP: 134/81  Pulse: 94  Weight: 190 lb 6.4 oz (86.4 kg)    Fetal Status: Fetal Heart Rate (bpm): 150 Fundal Height: 36 cm Movement: Present     General:  Alert, oriented and cooperative. Patient is in no acute distress.  Skin: Skin is warm and dry. No rash noted.   Cardiovascular: Normal heart rate noted  Respiratory: Normal respiratory effort, no problems with respiration noted  Abdomen: Soft, gravid, appropriate for gestational age.  Pain/Pressure: Present     Pelvic: Cervical exam performed in the presence of a chaperone Dilation: Fingertip Effacement (%): Thick Station: Ballotable  Extremities: Normal range of motion.  Edema: Trace  Mental Status: Normal mood and affect. Normal behavior. Normal judgment and thought content.   Assessment and Plan:  Pregnancy: G2P1001 at [redacted]w[redacted]d 1. Supervision of other normal pregnancy, antepartum Patient is doing well without complaints Cultures collected  2. History of gestational hypertension Normotensive and asymptomatic  Preterm labor symptoms and general obstetric precautions including but not limited to vaginal bleeding, contractions, leaking of fluid and fetal movement were reviewed  in detail with the patient. Please refer to After Visit Summary for other counseling recommendations.   No follow-ups on file.  Future Appointments  Date Time Provider [place]  04/23/2022  9:35 AM Shelly Bombard, MD [place] None  04/30/2022  9:55 AM Woodroe Mode, MD [facility] None    Mora Bellman, MD

## 2022-04-17 LAB — CERVICOVAGINAL ANCILLARY ONLY
Chlamydia: NEGATIVE
Comment: NEGATIVE
Comment: NORMAL
Neisseria Gonorrhea: NEGATIVE

## 2022-04-20 LAB — CULTURE, BETA STREP (GROUP B ONLY): Strep Gp B Culture: NEGATIVE

## 2022-04-23 ENCOUNTER — Ambulatory Visit (INDEPENDENT_AMBULATORY_CARE_PROVIDER_SITE_OTHER): Payer: Medicaid Other | Admitting: Obstetrics

## 2022-04-23 ENCOUNTER — Encounter: Payer: Medicaid Other | Admitting: Obstetrics and Gynecology

## 2022-04-23 ENCOUNTER — Encounter: Payer: Self-pay | Admitting: Obstetrics

## 2022-04-23 VITALS — BP 135/88 | HR 111 | Wt 195.0 lb

## 2022-04-23 DIAGNOSIS — O99013 Anemia complicating pregnancy, third trimester: Secondary | ICD-10-CM

## 2022-04-23 DIAGNOSIS — Z3A37 37 weeks gestation of pregnancy: Secondary | ICD-10-CM

## 2022-04-23 DIAGNOSIS — Z3480 Encounter for supervision of other normal pregnancy, unspecified trimester: Secondary | ICD-10-CM

## 2022-04-23 NOTE — Progress Notes (Signed)
Subjective:  Paige Stevens is a 22 y.o. G2P1001 at 77w3dbeing seen today for ongoing prenatal care.  She is currently monitored for the following issues for this low-risk pregnancy and has Family history of thyroid cancer; Supervision of other normal pregnancy, antepartum; and History of gestational hypertension on their problem list.  Patient reports no complaints.  Contractions: Not present. Vag. Bleeding: None.  Movement: Present. Denies leaking of fluid.   The following portions of the patient's history were reviewed and updated as appropriate: allergies, current medications, past family history, past medical history, past social history, past surgical history and problem list. Problem list updated.  Objective:   Vitals:   04/23/22 0949  BP: 135/88  Pulse: (!) 111  Weight: 195 lb (88.5 kg)    Fetal Status: Fetal Heart Rate (bpm): 144   Movement: Present     General:  Alert, oriented and cooperative. Patient is in no acute distress.  Skin: Skin is warm and dry. No rash noted.   Cardiovascular: Normal heart rate noted  Respiratory: Normal respiratory effort, no problems with respiration noted  Abdomen: Soft, gravid, appropriate for gestational age. Pain/Pressure: Present     Pelvic:  Cervical exam deferred        Extremities: Normal range of motion.  Edema: Trace  Mental Status: Normal mood and affect. Normal behavior. Normal judgment and thought content.   Urinalysis:      Assessment and Plan:  Pregnancy: G2P1001 at 354w3d1. Supervision of other normal pregnancy, antepartum  2. Anemia affecting pregnancy in third trimester   Term labor symptoms and general obstetric precautions including but not limited to vaginal bleeding, contractions, leaking of fluid and fetal movement were reviewed in detail with the patient. Please refer to After Visit Summary for other counseling recommendations.   No follow-ups on file.   HaShelly BombardMD 04/23/22

## 2022-04-30 ENCOUNTER — Ambulatory Visit (INDEPENDENT_AMBULATORY_CARE_PROVIDER_SITE_OTHER): Payer: Medicaid Other | Admitting: Obstetrics & Gynecology

## 2022-04-30 ENCOUNTER — Encounter: Payer: Medicaid Other | Admitting: Obstetrics & Gynecology

## 2022-04-30 VITALS — BP 127/83 | HR 93 | Wt 192.0 lb

## 2022-04-30 DIAGNOSIS — Z3A38 38 weeks gestation of pregnancy: Secondary | ICD-10-CM

## 2022-04-30 DIAGNOSIS — Z3480 Encounter for supervision of other normal pregnancy, unspecified trimester: Secondary | ICD-10-CM

## 2022-04-30 DIAGNOSIS — Z8759 Personal history of other complications of pregnancy, childbirth and the puerperium: Secondary | ICD-10-CM

## 2022-04-30 DIAGNOSIS — Z3483 Encounter for supervision of other normal pregnancy, third trimester: Secondary | ICD-10-CM

## 2022-04-30 NOTE — Progress Notes (Signed)
   PRENATAL VISIT NOTE  Subjective:  Paige Stevens is a 22 y.o. G2P1001 at 46w3dbeing seen today for ongoing prenatal care.  She is currently monitored for the following issues for this high-risk pregnancy and has Family history of thyroid cancer; Supervision of other normal pregnancy, antepartum; and History of gestational hypertension on their problem list.  Patient reports occasional contractions.  Contractions: Irritability. Vag. Bleeding: None.  Movement: Present. Denies leaking of fluid.   The following portions of the patient's history were reviewed and updated as appropriate: allergies, current medications, past family history, past medical history, past social history, past surgical history and problem list.   Objective:   Vitals:   04/30/22 0955  BP: 127/83  Pulse: 93  Weight: 87.1 kg    Fetal Status: Fetal Heart Rate (bpm): 143   Movement: Present     General:  Alert, oriented and cooperative. Patient is in no acute distress.  Skin: Skin is warm and dry. No rash noted.   Cardiovascular: Normal heart rate noted  Respiratory: Normal respiratory effort, no problems with respiration noted  Abdomen: Soft, gravid, appropriate for gestational age.  Pain/Pressure: Present     Pelvic: Cervical exam deferred        Extremities: Normal range of motion.  Edema: Trace  Mental Status: Normal mood and affect. Normal behavior. Normal judgment and thought content.   Assessment and Plan:  Pregnancy: G2P1001 at 382w3d. Supervision of other normal pregnancy, antepartum Labor precautions discussed   2. History of gestational hypertension BP normal   Term labor symptoms and general obstetric precautions including but not limited to vaginal bleeding, contractions, leaking of fluid and fetal movement were reviewed in detail with the patient. Please refer to After Visit Summary for other counseling recommendations.   Return in about 1 week (around 05/07/2022).  Future  Appointments  Date Time Provider [place]3/07/2022  9:55 AM WeLuvenia ReddenPA-C [facility] None  05/14/2022  9:55 AM HaShelly BombardMD [facility] None    JaEmeterio ReeveMD

## 2022-05-07 ENCOUNTER — Encounter: Payer: Self-pay | Admitting: Medical

## 2022-05-07 ENCOUNTER — Ambulatory Visit (INDEPENDENT_AMBULATORY_CARE_PROVIDER_SITE_OTHER): Payer: Medicaid Other | Admitting: Medical

## 2022-05-07 VITALS — BP 136/88 | HR 95 | Wt 195.0 lb

## 2022-05-07 DIAGNOSIS — Z3A39 39 weeks gestation of pregnancy: Secondary | ICD-10-CM

## 2022-05-07 DIAGNOSIS — Z3480 Encounter for supervision of other normal pregnancy, unspecified trimester: Secondary | ICD-10-CM

## 2022-05-07 DIAGNOSIS — Z2839 Other underimmunization status: Secondary | ICD-10-CM | POA: Insufficient documentation

## 2022-05-07 DIAGNOSIS — Z8759 Personal history of other complications of pregnancy, childbirth and the puerperium: Secondary | ICD-10-CM

## 2022-05-07 DIAGNOSIS — O09893 Supervision of other high risk pregnancies, third trimester: Secondary | ICD-10-CM

## 2022-05-07 NOTE — Progress Notes (Signed)
Pt presents for ROB visit. Pt c/o increased contractions. Requesting cervical check

## 2022-05-07 NOTE — Progress Notes (Signed)
   PRENATAL VISIT NOTE  Subjective:  Paige Stevens is a 22 y.o. G2P1001 at 69w3dbeing seen today for ongoing prenatal care.  She is currently monitored for the following issues for this low-risk pregnancy and has Family history of thyroid cancer; Supervision of other normal pregnancy, antepartum; History of gestational hypertension; and Rubella non-immune status, antepartum on their problem list.  Patient reports occasional contractions.  Contractions: Irregular. Vag. Bleeding: None.  Movement: Present. Denies leaking of fluid.   The following portions of the patient's history were reviewed and updated as appropriate: allergies, current medications, past family history, past medical history, past social history, past surgical history and problem list.   Objective:   Vitals:   05/07/22 1011  BP: 136/88  Pulse: 95  Weight: 195 lb (88.5 kg)    Fetal Status: Fetal Heart Rate (bpm): 147   Movement: Present     General:  Alert, oriented and cooperative. Patient is in no acute distress.  Skin: Skin is warm and dry. No rash noted.   Cardiovascular: Normal heart rate noted  Respiratory: Normal respiratory effort, no problems with respiration noted  Abdomen: Soft, gravid, appropriate for gestational age.  Pain/Pressure: Present     Pelvic: Cervical exam performed in the presence of a chaperone Dilation: Fingertip Effacement (%): 50 Station: -3  Extremities: Normal range of motion.  Edema: Trace  Mental Status: Normal mood and affect. Normal behavior. Normal judgment and thought content.   Assessment and Plan:  Pregnancy: G2P1001 at 353w3d. Supervision of other normal pregnancy, antepartum - GBS Negative  - Plan for IOL next week if no SOL   2. History of gestational hypertension - Normotensive today, mild non-pitting edema, no other concerning symptoms   3. [redacted] weeks gestation of pregnancy  4. Rubella non-immune status, antepartum - Plan for PP MMR   Term labor symptoms  and general obstetric precautions including but not limited to vaginal bleeding, contractions, leaking of fluid and fetal movement were reviewed in detail with the patient. Please refer to After Visit Summary for other counseling recommendations.   Return in about 1 week (around 05/14/2022) for LOB, In-Person with NST.  Future Appointments  Date Time Provider [place]3/02/2023  9:55 AM HaShelly BombardMD [facility] None    JuKerry HoughPA-C

## 2022-05-09 ENCOUNTER — Inpatient Hospital Stay (HOSPITAL_COMMUNITY): Payer: Medicaid Other | Admitting: Anesthesiology

## 2022-05-09 ENCOUNTER — Encounter (HOSPITAL_COMMUNITY): Payer: Self-pay | Admitting: Obstetrics and Gynecology

## 2022-05-09 ENCOUNTER — Inpatient Hospital Stay (HOSPITAL_COMMUNITY): Payer: Medicaid Other | Admitting: Certified Registered"

## 2022-05-09 ENCOUNTER — Inpatient Hospital Stay (HOSPITAL_COMMUNITY)
Admission: AD | Admit: 2022-05-09 | Discharge: 2022-05-10 | DRG: 807 | Disposition: A | Discharge status: Home / Self Care | Payer: Medicaid Other | Attending: Family Medicine | Admitting: Family Medicine

## 2022-05-09 ENCOUNTER — Other Ambulatory Visit: Payer: Self-pay

## 2022-05-09 DIAGNOSIS — Z23 Encounter for immunization: Secondary | ICD-10-CM

## 2022-05-09 DIAGNOSIS — O480 Post-term pregnancy: Secondary | ICD-10-CM | POA: Diagnosis present

## 2022-05-09 DIAGNOSIS — Z91040 Latex allergy status: Secondary | ICD-10-CM

## 2022-05-09 DIAGNOSIS — O134 Gestational [pregnancy-induced] hypertension without significant proteinuria, complicating childbirth: Secondary | ICD-10-CM | POA: Diagnosis present

## 2022-05-09 DIAGNOSIS — O139 Gestational [pregnancy-induced] hypertension without significant proteinuria, unspecified trimester: Secondary | ICD-10-CM | POA: Insufficient documentation

## 2022-05-09 DIAGNOSIS — Z3A39 39 weeks gestation of pregnancy: Secondary | ICD-10-CM | POA: Diagnosis not present

## 2022-05-09 LAB — COMPREHENSIVE METABOLIC PANEL
ALT: 21 U/L (ref 0–44)
AST: 37 U/L (ref 15–41)
Albumin: 3 g/dL — ABNORMAL LOW (ref 3.5–5.0)
Alkaline Phosphatase: 221 U/L — ABNORMAL HIGH (ref 38–126)
Anion gap: 12 (ref 5–15)
BUN: 7 mg/dL (ref 6–20)
CO2: 20 mmol/L — ABNORMAL LOW (ref 22–32)
Calcium: 9.4 mg/dL (ref 8.9–10.3)
Chloride: 103 mmol/L (ref 98–111)
Creatinine, Ser: 0.73 mg/dL (ref 0.44–1.00)
GFR, Estimated: >60 mL/min (ref >60)
Glucose, Bld: 98 mg/dL (ref 70–99)
Potassium: 4.3 mmol/L (ref 3.5–5.1)
Sodium: 135 mmol/L (ref 135–145)
Total Bilirubin: 0.4 mg/dL (ref 0.3–1.2)
Total Protein: 6.5 g/dL (ref 6.5–8.1)

## 2022-05-09 LAB — CBC
HCT: 34.6 % — ABNORMAL LOW (ref 36.0–46.0)
Hemoglobin: 11.8 g/dL — ABNORMAL LOW (ref 12.0–15.0)
MCH: 30.7 pg (ref 26.0–34.0)
MCHC: 34.1 g/dL (ref 30.0–36.0)
MCV: 90.1 fL (ref 80.0–100.0)
Platelets: 208 10*3/uL (ref 150–400)
RBC: 3.84 MIL/uL — ABNORMAL LOW (ref 3.87–5.11)
RDW: 13.9 % (ref 11.5–15.5)
WBC: 8.8 10*3/uL (ref 4.0–10.5)
nRBC: 0 % (ref 0.0–0.2)

## 2022-05-09 LAB — PROTEIN / CREATININE RATIO, URINE
Creatinine, Urine: 175 mg/dL
Protein Creatinine Ratio: 0.1 mg/mg{Cre} (ref 0.00–0.15)
Total Protein, Urine: 17 mg/dL

## 2022-05-09 LAB — TYPE AND SCREEN
ABO/RH(D): O POS
Antibody Screen: NEGATIVE

## 2022-05-09 LAB — RPR: RPR Ser Ql: NONREACTIVE

## 2022-05-09 MED ORDER — IBUPROFEN 600 MG PO TABS
600.0000 mg | ORAL_TABLET | Freq: Four times a day (QID) | ORAL | Status: DC
  Administered 2022-05-09 – 2022-05-10 (×5): 600 mg via ORAL
  Filled 2022-05-09 (×5): qty 1

## 2022-05-09 MED ORDER — COCONUT OIL OIL
1.0000 | TOPICAL_OIL | Status: DC | PRN
  Administered 2022-05-09: 1 via TOPICAL

## 2022-05-09 MED ORDER — MISOPROSTOL 25 MCG QUARTER TABLET: 25.0000 ug | ORAL_TABLET | Freq: Once | ORAL | Status: DC

## 2022-05-09 MED ORDER — SIMETHICONE 80 MG PO CHEW: 80.0000 mg | CHEWABLE_TABLET | ORAL | Status: DC | PRN

## 2022-05-09 MED ORDER — EPHEDRINE 5 MG/ML INJ: 10.0000 mg | INTRAVENOUS | Status: DC | PRN

## 2022-05-09 MED ORDER — ZOLPIDEM TARTRATE 5 MG PO TABS: 5.0000 mg | ORAL_TABLET | Freq: Every evening | ORAL | Status: DC | PRN

## 2022-05-09 MED ORDER — DIBUCAINE (PERIANAL) 1 % EX OINT: 1.0000 | TOPICAL_OINTMENT | CUTANEOUS | Status: DC | PRN

## 2022-05-09 MED ORDER — PRENATAL MULTIVITAMIN CH
1.0000 | ORAL_TABLET | Freq: Every day | ORAL | Status: DC
  Administered 2022-05-09 – 2022-05-10 (×2): 1 via ORAL
  Filled 2022-05-09 (×2): qty 1

## 2022-05-09 MED ORDER — LIDOCAINE HCL (PF) 1 % IJ SOLN: 30.0000 mL | INTRAMUSCULAR | Status: DC | PRN

## 2022-05-09 MED ORDER — LACTATED RINGERS IV SOLN
500.0000 mL | Freq: Once | INTRAVENOUS | Status: AC
  Administered 2022-05-09: 500 mL via INTRAVENOUS

## 2022-05-09 MED ORDER — TRANEXAMIC ACID-NACL 1000-0.7 MG/100ML-% IV SOLN
1000.0000 mg | Freq: Once | INTRAVENOUS | Status: AC
  Administered 2022-05-09: 1000 mg via INTRAVENOUS
  Filled 2022-05-09: qty 100

## 2022-05-09 MED ORDER — OXYCODONE-ACETAMINOPHEN 5-325 MG PO TABS: 1.0000 | ORAL_TABLET | ORAL | Status: DC | PRN

## 2022-05-09 MED ORDER — ACETAMINOPHEN 325 MG PO TABS
650.0000 mg | ORAL_TABLET | ORAL | Status: DC | PRN
  Administered 2022-05-09: 650 mg via ORAL
  Filled 2022-05-09: qty 2

## 2022-05-09 MED ORDER — SENNOSIDES-DOCUSATE SODIUM 8.6-50 MG PO TABS
2.0000 | ORAL_TABLET | ORAL | Status: DC
  Administered 2022-05-09 – 2022-05-10 (×2): 2 via ORAL
  Filled 2022-05-09 (×2): qty 2

## 2022-05-09 MED ORDER — MEASLES, MUMPS & RUBELLA VAC IJ SOLR
0.5000 mL | Freq: Once | INTRAMUSCULAR | Status: AC
  Administered 2022-05-10: 0.5 mL via SUBCUTANEOUS
  Filled 2022-05-09: qty 0.5

## 2022-05-09 MED ORDER — WITCH HAZEL-GLYCERIN EX PADS
1.0000 | MEDICATED_PAD | CUTANEOUS | Status: DC | PRN
  Administered 2022-05-09: 1 via TOPICAL

## 2022-05-09 MED ORDER — ONDANSETRON HCL 4 MG/2ML IJ SOLN: 4.0000 mg | INTRAMUSCULAR | Status: DC | PRN

## 2022-05-09 MED ORDER — PHENYLEPHRINE 80 MCG/ML (10ML) SYRINGE FOR IV PUSH (FOR BLOOD PRESSURE SUPPORT): 80.0000 ug | PREFILLED_SYRINGE | INTRAVENOUS | Status: DC | PRN

## 2022-05-09 MED ORDER — BUPIVACAINE HCL (PF) 0.25 % IJ SOLN
INTRAMUSCULAR | Status: DC | PRN
  Administered 2022-05-09: 10 mL via EPIDURAL

## 2022-05-09 MED ORDER — LACTATED RINGERS IV SOLN: INTRAVENOUS | Status: DC

## 2022-05-09 MED ORDER — LIDOCAINE HCL (PF) 1 % IJ SOLN
INTRAMUSCULAR | Status: DC | PRN
  Administered 2022-05-09: 11 mL via EPIDURAL

## 2022-05-09 MED ORDER — DIPHENHYDRAMINE HCL 50 MG/ML IJ SOLN: 12.5000 mg | INTRAMUSCULAR | Status: DC | PRN

## 2022-05-09 MED ORDER — TERBUTALINE SULFATE 1 MG/ML IJ SOLN: 0.2500 mg | Freq: Once | INTRAMUSCULAR | Status: DC | PRN

## 2022-05-09 MED ORDER — BENZOCAINE-MENTHOL 20-0.5 % EX AERO
1.0000 | INHALATION_SPRAY | CUTANEOUS | Status: DC | PRN
  Administered 2022-05-09: 1 via TOPICAL
  Filled 2022-05-09: qty 56

## 2022-05-09 MED ORDER — SODIUM CHLORIDE 0.9 % IV SOLN: 250.0000 mL | INTRAVENOUS | Status: DC | PRN

## 2022-05-09 MED ORDER — FENTANYL-BUPIVACAINE-NACL 0.5-0.125-0.9 MG/250ML-% EP SOLN
12.0000 mL/h | EPIDURAL | Status: DC | PRN
  Administered 2022-05-09: 12 mL/h via EPIDURAL
  Filled 2022-05-09: qty 250

## 2022-05-09 MED ORDER — SOD CITRATE-CITRIC ACID 500-334 MG/5ML PO SOLN: 30.0000 mL | ORAL | Status: DC | PRN

## 2022-05-09 MED ORDER — SODIUM CHLORIDE 0.9% FLUSH: 3.0000 mL | INTRAVENOUS | Status: DC | PRN

## 2022-05-09 MED ORDER — OXYTOCIN BOLUS FROM INFUSION
333.0000 mL | Freq: Once | INTRAVENOUS | Status: AC
  Administered 2022-05-09: 333 mL via INTRAVENOUS

## 2022-05-09 MED ORDER — LACTATED RINGERS IV SOLN: 500.0000 mL | INTRAVENOUS | Status: DC | PRN

## 2022-05-09 MED ORDER — ACETAMINOPHEN 325 MG PO TABS: 650.0000 mg | ORAL_TABLET | ORAL | Status: DC | PRN

## 2022-05-09 MED ORDER — SODIUM CHLORIDE 0.9% FLUSH: 3.0000 mL | Freq: Two times a day (BID) | INTRAVENOUS | Status: DC

## 2022-05-09 MED ORDER — OXYTOCIN-SODIUM CHLORIDE 30-0.9 UT/500ML-% IV SOLN
2.5000 [IU]/h | INTRAVENOUS | Status: DC
  Filled 2022-05-09: qty 500

## 2022-05-09 MED ORDER — DIPHENHYDRAMINE HCL 25 MG PO CAPS: 25.0000 mg | ORAL_CAPSULE | Freq: Four times a day (QID) | ORAL | Status: DC | PRN

## 2022-05-09 MED ORDER — ONDANSETRON HCL 4 MG PO TABS: 4.0000 mg | ORAL_TABLET | ORAL | Status: DC | PRN

## 2022-05-09 MED ORDER — OXYCODONE-ACETAMINOPHEN 5-325 MG PO TABS: 2.0000 | ORAL_TABLET | ORAL | Status: DC | PRN

## 2022-05-09 MED ORDER — FUROSEMIDE 20 MG PO TABS
20.0000 mg | ORAL_TABLET | Freq: Every day | ORAL | Status: DC
  Administered 2022-05-09 – 2022-05-10 (×2): 20 mg via ORAL
  Filled 2022-05-09 (×2): qty 1

## 2022-05-09 MED ORDER — ONDANSETRON HCL 4 MG/2ML IJ SOLN: 4.0000 mg | Freq: Four times a day (QID) | INTRAMUSCULAR | Status: DC | PRN

## 2022-05-09 NOTE — H&P (Addendum)
OBSTETRIC ADMISSION HISTORY AND PHYSICAL  Paige Stevens is a 22 y.o. female G2P1001 with IUP at 51w5dby 9wk UKoreapresenting for IOL due to gHTN. She reports +FMs, No LOF, no VB, no blurry vision, headaches or peripheral edema, and RUQ pain.  She plans on breast feeding. She request minipill for birth control. [facility]    Dating: By 9Patsy Lager[place]--->  Estimated Date of Delivery: 05/11/22  Sono:    '@[redacted]w[redacted]d'$ , CWD, normal anatomy, breech presentation, anterior placenta, 694g, 47% EFW   Prenatal History/Complications: gHTN, hx gestational HTN, rubella non-immune  Past Medical History: Past Medical History:  Diagnosis Date   Anemia    Anxiety    Depression    Hypertension     Past Surgical History: Past Surgical History:  Procedure Laterality Date   NO PAST SURGERIES      Obstetrical History: OB History     Gravida  2   Para  1   Term  1   Preterm      AB      Living  1      SAB      IAB      Ectopic      Multiple  0   Live Births  1           Social History Social History   Socioeconomic History   Marital status: Single    Spouse name: ESports coach  Number of children: Not on file   Years of education: Not on file   Highest education level: Not on file  Occupational History   Not on file  Tobacco Use   Smoking status: Never   Smokeless tobacco: Never  Vaping Use   Vaping Use: Never used  Substance and Sexual Activity   Alcohol use: Not Currently    Comment: occ prior to pregnancy   Drug use: Never   Sexual activity: Yes    Partners: Male    Birth control/protection: Pill    Comment: Currently pregnant  Other Topics Concern   Not on file  Social History Narrative   Not on file   Social Determinants of Health   Financial Resource Strain: Not on file  Food Insecurity: No Food Insecurity (05/09/2022)   Hunger Vital Sign    Worried About Running Out of Food in the Last Year: Never true    Ran Out of Food  in the Last Year: Never true  Transportation Needs: No Transportation Needs (05/09/2022)   PRAPARE - THydrologist(Medical): No    Lack of Transportation (Non-Medical): No  Physical Activity: Not on file  Stress: Not on file  Social Connections: Not on file    Family History: Family History  Problem Relation Age of Onset   Hypothyroidism Mother    Hypertension Father    Diabetes Paternal Grandmother    Hypertension Paternal Grandmother    Hypertension Paternal Grandfather     Allergies: Allergies  Allergen Reactions   Latex Rash    [place]   Medications Prior to Admission  Medication Sig Dispense Refill Last Dose   Prenatal Vit-Fe Fumarate-FA (PREPLUS) 27-1 MG TABS Take by mouth.   05/08/2022   ampicillin (PRINCIPEN) 500 MG capsule Take 1 capsule (500 mg total) by mouth 3 (three) times daily. (Patient not taking: Reported on 05/07/2022) 21 capsule 0    cyclobenzaprine (FLEXERIL) 10 MG tablet Take 1 tablet (  10 mg total) by mouth every 8 (eight) hours as needed for muscle spasms. (Patient not taking: Reported on 05/07/2022) 30 tablet 1    ferrous sulfate 325 (65 FE) MG tablet Take 1 tablet (325 mg total) by mouth every other day. (Patient not taking: Reported on 05/07/2022) 30 tablet 3      Review of Systems   All systems reviewed and negative except as stated in HPI  Blood pressure (!) 134/91, pulse (!) 121, temperature 98.4 F (36.9 C), temperature source Oral, resp. rate 18, height '5\' 3"'$  (1.6 m), weight 88.6 kg, last menstrual period 07/22/2021, SpO2 99 %, unknown if currently breastfeeding.  General appearance: alert, cooperative, and appears stated age Lungs: clear to auscultation bilaterally Heart: regular rate and rhythm Abdomen: soft, non-tender; bowel sounds normal Pelvic: see below Extremities: Homans sign is negative, no sign of DVT  Presentation: cephalic  Fetal monitoring: 150/moderate/accelerations present,  early decels present Uterine activityFrequency: Every 1-3 minutes Dilation: 5 Effacement (%): 70 Station: -2 Exam by:: Huston Foley RN   Prenatal labs: ABO, Rh: --/--/O POS (03/07 0601) Antibody: NEG (03/07 0601) Rubella: <0.90 (08/25 0942) RPR: NON REACTIVE (03/07 0601)  HBsAg: Negative (08/25 0942)  HIV: Non Reactive (12/18 0855)  GBS: Negative/-- (02/13 1025)  1 hr Glucola normal Genetic screening  NIPS: Low risk, AFP: neg, Horizon: negative Anatomy US wnl 10/16  Prenatal Transfer Tool  Maternal Diabetes: No Genetic Screening: Normal Maternal Ultrasounds/Referrals: Normal Fetal Ultrasounds or other Referrals:  None Maternal Substance Abuse:  No Significant Maternal Medications:  None Significant Maternal Lab Results:  Other:  Rubella NI Number of Prenatal Visits:greater than 3 verified prenatal visits Other Comments:  None  Results for orders placed or performed during the hospital encounter of 05/09/22 (from the past 24 hour(s))  CBC   Collection Time: 05/09/22  6:01 AM  Result Value Ref Range   WBC 8.8 4.0 - 10.5 K/uL   RBC 3.84 (L) 3.87 - 5.11 MIL/uL   Hemoglobin 11.8 (L) 12.0 - 15.0 g/dL   HCT 34.6 (L) 36.0 - 46.0 %   MCV 90.1 80.0 - 100.0 fL   MCH 30.7 26.0 - 34.0 pg   MCHC 34.1 30.0 - 36.0 g/dL   RDW 13.9 11.5 - 15.5 %   Platelets 208 150 - 400 K/uL   nRBC 0.0 0.0 - 0.2 %  Comprehensive metabolic panel   Collection Time: 05/09/22  6:01 AM  Result Value Ref Range   Sodium 135 135 - 145 mmol/L   Potassium 4.3 3.5 - 5.1 mmol/L   Chloride 103 98 - 111 mmol/L   CO2 20 (L) 22 - 32 mmol/L   Glucose, Bld 98 70 - 99 mg/dL   BUN 7 6 - 20 mg/dL   Creatinine, Ser 0.73 0.44 - 1.00 mg/dL   Calcium 9.4 8.9 - 10.3 mg/dL   Total Protein 6.5 6.5 - 8.1 g/dL   Albumin 3.0 (L) 3.5 - 5.0 g/dL   AST 37 15 - 41 U/L   ALT 21 0 - 44 U/L   Alkaline Phosphatase 221 (H) 38 - 126 U/L   Total Bilirubin 0.4 0.3 - 1.2 mg/dL   GFR, Estimated >60 >60 mL/min   Anion gap 12 5 - 15   RPR   Collection Time: 05/09/22  6:01 AM  Result Value Ref Range   RPR Ser Ql NON REACTIVE NON REACTIVE  Type and screen   Collection Time: 05/09/22  6:01 AM  Result Value Ref Range  ABO/RH(D) O POS    Antibody Screen NEG    Sample Expiration      05/12/2022,2359 Performed at [facility], [place] [address], [place], [place] [address]     Patient Active Problem List   Diagnosis Date Noted   Post-dates pregnancy 05/09/2022   Rubella non-immune status, antepartum 05/07/2022   History of gestational hypertension 10/26/2021   Supervision of other normal pregnancy, antepartum 09/18/2021   Family history of thyroid cancer 11/13/2015    Assessment/Plan:  Patsy Hershey is a 22 y.o. G2P1001 at 39w5dhere for IOL due to gHTN.  #Labor: Will allow labor to progress naturally without augmentation. Offered AROM but patient declined. #Pain: Epidural #FWB: Cat I #ID:  GBS neg; will need ppMMR for rubella NI #MOF: breast #MOC: mini-pill #Circ:  N/a #gHTN: New diagnosis. PreE labs wnl. Pressures elevated now, likely in the setting of pain.   LDarlen Round MD PGY-1 05/09/2022, 9:00 AM   Fellow Attestation  I saw and evaluated the patient, performing the key elements of the service.I  personally performed or re-performed the history, physical exam, and medical decision making activities of this service and have verified that the service and findings are accurately documented in the resident's note. I developed the management plan that is described in the resident's note, and I agree with the content, with my edits above.   New onset hypertension noted on admission, still elevated despite epidural. Meets criteria for gHTN. Labs normal.   CStormy Card MD,[facility] OB Fellow, Faculty Practice  05/09/2022 12:39 PM

## 2022-05-09 NOTE — MAU Note (Signed)
.  Paige Stevens is a 22 y.o. at 26w5dhere in MAU reporting: CTX since 0300 about 2 mins apart. Pt denies VB, LOF, DFM, PIH s/s, and complications in the pregnancy. Last intercourse 3 days ago.  HSV denies SVE last UKN  GBS neg Onset of complaint: 0300 Pain score: 9/10 Vitals:   05/09/22 0542  BP: (!) 150/99  Pulse: (!) 123  Resp: 18  Temp: 98.4 F (36.9 C)     FHT:135 Lab orders placed from triage:

## 2022-05-09 NOTE — Discharge Summary (Signed)
Postpartum Discharge Summary      Patient Name: Paige Stevens DOB: [birth year] MRN: [id number]  Date of admission: 05/09/2022 Delivery date:05/09/2022  Delivering provider: Stormy Card  Date of discharge: 05/10/2022  Admitting diagnosis: Post-dates pregnancy [O48.0] Intrauterine pregnancy: [redacted]w[redacted]d     Secondary diagnosis:  Principal Problem:   SVD (spontaneous vaginal delivery) Active Problems:   Post-dates pregnancy   Gestational hypertension  Additional problems: none    Discharge diagnosis: Term Pregnancy Delivered and Gestational Hypertension                                              Post partum procedures:none Augmentation: None Complications: None  Hospital course: Onset of Labor With Vaginal Delivery      22 y.o. yo [id number] at [redacted]w[redacted]d was admitted in Latent Labor on 05/09/2022. Labor course was complicated by intrapartum gestational hypertension.  Membrane Rupture Time/Date: 10:30 AM ,05/09/2022   Delivery Method:Vaginal, Spontaneous  Episiotomy: None  Lacerations:  None  Patient had a postpartum course complicated by BPs meeting threshold for antihypertensive medication.  She is ambulating, tolerating a regular diet, passing flatus, and urinating well. Patient is discharged home in stable condition on 05/10/22.  Newborn Data: Birth date:05/09/2022  Birth time:10:36 AM  Gender:Female  Living status:Living  Apgars:7 ,9  Weight:3360 g   Magnesium Sulfate received: No BMZ received: No Rhophylac:N/A MMR:non immune T-DaP:Given prenatally Flu: given prenatally Transfusion:No  Physical exam  Vitals:   05/09/22 2119 05/10/22 0111 05/10/22 0228 05/10/22 0607  BP:  123/87 133/81 125/80  Pulse: 91 75 92 87  Resp: 16 16 18 18   Temp: 99 F (37.2 C) 98.7 F (37.1 C)  98.5 F (36.9 C)  TempSrc: Oral     SpO2: 99% 100%  99%  Weight:      Height:       General: alert, cooperative, and no distress Lochia: appropriate Uterine Fundus: firm Incision:  N/A DVT Evaluation: No evidence of DVT seen on physical exam. Negative Homan's sign. No cords or calf tenderness. Labs: Lab Results  Component Value Date   WBC 10.2 05/10/2022   HGB 10.5 (L) 05/10/2022   HCT 29.2 (L) 05/10/2022   MCV 88.2 05/10/2022   PLT 184 05/10/2022      Latest Ref Rng & Units 05/09/2022    6:01 AM  CMP  Glucose 70 - 99 mg/dL 98   BUN 6 - 20 mg/dL 7   Creatinine 0.44 - 1.00 mg/dL 0.73   Sodium 135 - 145 mmol/L 135   Potassium 3.5 - 5.1 mmol/L 4.3   Chloride 98 - 111 mmol/L 103   CO2 22 - 32 mmol/L 20   Calcium 8.9 - 10.3 mg/dL 9.4   Total Protein 6.5 - 8.1 g/dL 6.5   Total Bilirubin 0.3 - 1.2 mg/dL 0.4   Alkaline Phos 38 - 126 U/L 221   AST 15 - 41 U/L 37   ALT 0 - 44 U/L 21    Edinburgh Score:    05/10/2022    1:10 AM  Edinburgh Postnatal Depression Scale Screening Tool  I have been able to laugh and see the funny side of things. 0  I have looked forward with enjoyment to things. 0  I have blamed myself unnecessarily when things went wrong. 3  I have been anxious or worried for no good reason. 2  I have felt scared or panicky for no good reason. 2  Things have been getting on top of me. 1  I have been so unhappy that I have had difficulty sleeping. 0  I have felt sad or miserable. 0  I have been so unhappy that I have been crying. 0  The thought of harming myself has occurred to me. 0  Edinburgh Postnatal Depression Scale Total 8     After visit meds:  Allergies as of 05/10/2022       Reactions   Latex Rash   Per prenatal records from [place]        Medication List     STOP taking these medications    ampicillin 500 MG capsule Commonly known as: PRINCIPEN   cyclobenzaprine 10 MG tablet Commonly known as: FLEXERIL       TAKE these medications    acetaminophen 325 MG tablet Commonly known as: Tylenol Take 2 tablets (650 mg total) by mouth every 4 (four) hours as needed (for pain scale < 4).   ferrous sulfate 325 (65 FE) MG  tablet Take 1 tablet (325 mg total) by mouth every other day.   furosemide 20 MG tablet Commonly known as: LASIX Take 1 tablet (20 mg total) by mouth daily.   ibuprofen 600 MG tablet Commonly known as: ADVIL Take 1 tablet (600 mg total) by mouth every 6 (six) hours.   NIFEdipine 30 MG 24 hr tablet Commonly known as: ADALAT CC Take 1 tablet (30 mg total) by mouth daily.   PrePLUS 27-1 MG Tabs Take by mouth.   Slynd 4 MG Tabs Generic drug: Drospirenone Take 1 tablet (4 mg total) by mouth daily. Start on the Sunday after the baby turns 77 weeks old         Discharge home in stable condition Infant Feeding: Breast Infant Disposition:home with mother Discharge instruction: per After Visit Summary and Postpartum booklet. Activity: Advance as tolerated. Pelvic rest for 6 weeks.  Diet: routine diet Future Appointments: Future Appointments  Date Time Provider [place]  05/16/2022 10:40 AM [facility] None  06/20/2022 10:15 AM Shelly Bombard, MD [facility] None   Follow up Visit:  The following message was sent to [facility] by Mikki Santee, MD  Please schedule this patient for a In person postpartum visit in 6 weeks with the following provider: Any provider. Additional Postpartum F/U:BP check 1 week  High risk pregnancy complicated by: HTN Delivery mode:  Vaginal, Spontaneous  Anticipated Birth Control:  POPs   05/10/2022 Christin Fudge, CNM

## 2022-05-09 NOTE — Anesthesia Preprocedure Evaluation (Addendum)
Anesthesia Evaluation  Patient identified by MRN, date of birth, ID band Patient awake    Reviewed: Allergy & Precautions, NPO status , Patient's Chart, lab work & pertinent test results  Airway Mallampati: II  TM Distance: >3 FB Neck ROM: Full    Dental no notable dental hx. (+) Teeth Intact, Dental Advisory Given   Pulmonary neg pulmonary ROS   Pulmonary exam normal breath sounds clear to auscultation       Cardiovascular hypertension, Normal cardiovascular exam Rhythm:Regular Rate:Normal  gHTN   Neuro/Psych   Anxiety     negative neurological ROS     GI/Hepatic negative GI ROS, Neg liver ROS,,,  Endo/Other  negative endocrine ROS    Renal/GU negative Renal ROS     Musculoskeletal   Abdominal   Peds  Hematology Lab Results      Component                Value               Date                      WBC                      15.5 (H)            12/17/2019                HGB                      11.0 (L)            12/17/2019                HCT                      32.2 (L)            12/17/2019                MCV                      88.7                12/17/2019                PLT                      241                 12/17/2019              Anesthesia Other Findings   Reproductive/Obstetrics (+) Pregnancy                              Anesthesia Physical Anesthesia Plan  ASA: III  Anesthesia Plan: Epidural   Post-op Pain Management:    Induction:   PONV Risk Score and Plan:   Airway Management Planned:   Additional Equipment:   Intra-op Plan:   Post-operative Plan:   Informed Consent: I have reviewed the patients History and Physical, chart, labs and discussed the procedure including the risks, benefits and alternatives for the proposed anesthesia with the patient or authorized representative who has indicated his/her understanding and acceptance.       Plan  Discussed with:   Anesthesia Plan Comments: (37.6 G1P0 w gHTn  for LEA)         Anesthesia Quick Evaluation

## 2022-05-09 NOTE — Anesthesia Procedure Notes (Signed)
Epidural Patient location during procedure: OB Start time: 05/09/2022 7:50 AM End time: 05/09/2022 8:06 AM  Staffing Anesthesiologist: Lynda Rainwater, MD Performed: anesthesiologist   Preanesthetic Checklist Completed: patient identified, IV checked, site marked, risks and benefits discussed, surgical consent, monitors and equipment checked, pre-op evaluation and timeout performed  Epidural Patient position: sitting Prep: ChloraPrep Patient monitoring: heart rate, cardiac monitor, continuous pulse ox and blood pressure Approach: midline Injection technique: LOR air  Needle:  Needle type: Tuohy  Needle gauge: 17 G Needle length: 9 cm Needle insertion depth: 6 cm Catheter type: closed end flexible Catheter size: 20 Guage Catheter at skin depth: 9 cm Test dose: negative  Assessment Events: blood not aspirated, injection not painful, no injection resistance, no paresthesia and negative IV test  Additional Notes Reason for block:procedure for pain

## 2022-05-09 NOTE — Anesthesia Postprocedure Evaluation (Signed)
Anesthesia Post Note  Patient: Paige Stevens  Procedure(s) Performed: AN AD [place]     Patient location during evaluation: Mother Baby Anesthesia Type: Epidural Level of consciousness: awake and alert Pain management: pain level controlled Vital Signs Assessment: post-procedure vital signs reviewed and stable Respiratory status: spontaneous breathing, nonlabored ventilation and respiratory function stable Cardiovascular status: stable Postop Assessment: no headache, no backache and epidural receding Anesthetic complications: no   No notable events documented.  Last Vitals:  Vitals:   05/09/22 1240 05/09/22 1400  BP: 126/89 131/84  Pulse: 88 88  Resp: 20 18  Temp: 36.9 C 36.7 C  SpO2: 99% 99%    Last Pain:  Vitals:   05/09/22 1802  TempSrc:   PainSc: 0-No pain   Pain Goal:                   Stefani Dama

## 2022-05-10 ENCOUNTER — Other Ambulatory Visit (HOSPITAL_COMMUNITY): Payer: Self-pay

## 2022-05-10 LAB — CBC
HCT: 29.2 % — ABNORMAL LOW (ref 36.0–46.0)
Hemoglobin: 10.5 g/dL — ABNORMAL LOW (ref 12.0–15.0)
MCH: 31.7 pg (ref 26.0–34.0)
MCHC: 36 g/dL (ref 30.0–36.0)
MCV: 88.2 fL (ref 80.0–100.0)
Platelets: 184 10*3/uL (ref 150–400)
RBC: 3.31 MIL/uL — ABNORMAL LOW (ref 3.87–5.11)
RDW: 14.3 % (ref 11.5–15.5)
WBC: 10.2 10*3/uL (ref 4.0–10.5)
nRBC: 0 % (ref 0.0–0.2)

## 2022-05-10 LAB — BIRTH TISSUE RECOVERY COLLECTION (PLACENTA DONATION)

## 2022-05-10 MED ORDER — FUROSEMIDE 20 MG PO TABS
20.0000 mg | ORAL_TABLET | Freq: Every day | ORAL | 0 refills | Status: DC
  Filled 2022-05-10: qty 4, 4d supply, fill #0

## 2022-05-10 MED ORDER — NIFEDIPINE ER OSMOTIC RELEASE 30 MG PO TB24
30.0000 mg | ORAL_TABLET | Freq: Every day | ORAL | Status: DC
  Administered 2022-05-10: 30 mg via ORAL
  Filled 2022-05-10: qty 1

## 2022-05-10 MED ORDER — SLYND 4 MG PO TABS: 1.0000 | ORAL_TABLET | Freq: Every day | ORAL | 11 refills | Status: DC

## 2022-05-10 MED ORDER — NIFEDIPINE ER 30 MG PO TB24
30.0000 mg | ORAL_TABLET | Freq: Every day | ORAL | 0 refills | Status: DC
  Filled 2022-05-10: qty 90, 90d supply, fill #0

## 2022-05-10 MED ORDER — ACETAMINOPHEN 325 MG PO TABS: 650.0000 mg | ORAL_TABLET | ORAL | Status: DC | PRN

## 2022-05-10 MED ORDER — IBUPROFEN 600 MG PO TABS
600.0000 mg | ORAL_TABLET | Freq: Four times a day (QID) | ORAL | 0 refills | Status: DC
  Filled 2022-05-10: qty 30, 8d supply, fill #0

## 2022-05-14 ENCOUNTER — Encounter: Payer: Medicaid Other | Admitting: Obstetrics

## 2022-05-14 NOTE — Anesthesia Postprocedure Evaluation (Signed)
Anesthesia Post Note  Patient: Paige Stevens  Procedure(s) Performed: AN AD [place]     Patient location during evaluation: Mother Baby Anesthesia Type: Epidural Level of consciousness: awake and alert Pain management: pain level controlled Vital Signs Assessment: post-procedure vital signs reviewed and stable Respiratory status: spontaneous breathing, nonlabored ventilation and respiratory function stable Cardiovascular status: blood pressure returned to baseline and stable Postop Assessment: no apparent nausea or vomiting Anesthetic complications: no   No notable events documented.  Last Vitals:  Vitals:   05/10/22 0228 05/10/22 0607  BP: 133/81 125/80  Pulse: 92 87  Resp: 18 18  Temp:  36.9 C  SpO2:  99%    Last Pain:  Vitals:   05/10/22 1208  TempSrc:   PainSc: 0-No pain   Pain Goal:                   Lynda Rainwater

## 2022-05-16 ENCOUNTER — Ambulatory Visit: Payer: Medicaid Other | Admitting: Emergency Medicine

## 2022-05-16 VITALS — BP 126/88 | HR 84 | Ht 63.0 in | Wt 178.0 lb

## 2022-05-16 DIAGNOSIS — Z8759 Personal history of other complications of pregnancy, childbirth and the puerperium: Secondary | ICD-10-CM

## 2022-05-16 NOTE — Progress Notes (Addendum)
Pt presents for PP BP check.  SVD on 05/09/2022.  Started on Nifedipine on 3/8 for Gestational Htn.  BP 136/91 and 126/88. Endorses occasional midday headaches that resolves with rest.    Pt advised to continue Nifedipine as prescribed. Given precautions to return to office or present to MAU.

## 2022-05-18 ENCOUNTER — Inpatient Hospital Stay (HOSPITAL_COMMUNITY): Payer: Medicaid Other

## 2022-05-18 ENCOUNTER — Inpatient Hospital Stay (HOSPITAL_COMMUNITY)
Admission: RE | Admit: 2022-05-18 | Payer: Medicaid Other | Source: Home / Self Care | Admitting: Obstetrics and Gynecology

## 2022-06-20 ENCOUNTER — Encounter: Payer: Self-pay | Admitting: Obstetrics

## 2022-06-20 ENCOUNTER — Ambulatory Visit (INDEPENDENT_AMBULATORY_CARE_PROVIDER_SITE_OTHER): Payer: Medicaid Other | Admitting: Obstetrics

## 2022-06-20 DIAGNOSIS — Z3202 Encounter for pregnancy test, result negative: Secondary | ICD-10-CM

## 2022-06-20 DIAGNOSIS — Z30011 Encounter for initial prescription of contraceptive pills: Secondary | ICD-10-CM

## 2022-06-20 DIAGNOSIS — Z3009 Encounter for other general counseling and advice on contraception: Secondary | ICD-10-CM | POA: Diagnosis not present

## 2022-06-20 DIAGNOSIS — Z8759 Personal history of other complications of pregnancy, childbirth and the puerperium: Secondary | ICD-10-CM

## 2022-06-20 DIAGNOSIS — J301 Allergic rhinitis due to pollen: Secondary | ICD-10-CM

## 2022-06-20 LAB — POCT URINE PREGNANCY: Preg Test, Ur: NEGATIVE

## 2022-06-20 MED ORDER — NORETHINDRONE 0.35 MG PO TABS: 1.0000 | ORAL_TABLET | Freq: Every day | ORAL | 11 refills | Status: DC

## 2022-06-20 MED ORDER — LORATADINE 10 MG PO TABS: 10.0000 mg | ORAL_TABLET | Freq: Every day | ORAL | 11 refills | Status: DC

## 2022-06-20 NOTE — Progress Notes (Signed)
UPT today is Negative.  Pt wants OCP for Birthcontrol.  Post Partum Visit Note  Paige Stevens is a 22 y.o. G11P2002 female who presents for a postpartum visit. She is 6 weeks postpartum following a normal spontaneous vaginal delivery.  I have fully reviewed the prenatal and intrapartum course. The delivery was at 39.5 gestational weeks.  Anesthesia: epidural. Postpartum course has been unremarkable. Baby is doing well. Baby is feeding by breast. Bleeding no bleeding. Bowel function is normal. Bladder function is normal. Patient is not sexually active. Contraception method is OCP (estrogen/progesterone). Postpartum depression screening: negative.   Upstream - 06/20/22 1035       Pregnancy Intention Screening   Does the patient want to become pregnant in the next year? No    Does the patient's partner want to become pregnant in the next year? No    Would the patient like to discuss contraceptive options today? Yes            The pregnancy intention screening data noted above was reviewed. Potential methods of contraception were discussed. The patient elected to proceed with No data recorded.   Edinburgh Postnatal Depression Scale - 06/20/22 1034       Edinburgh Postnatal Depression Scale:  In the Past 7 Days   I have been able to laugh and see the funny side of things. 0    I have looked forward with enjoyment to things. 0    I have blamed myself unnecessarily when things went wrong. 0    I have been anxious or worried for no good reason. 2    I have felt scared or panicky for no good reason. 0    Things have been getting on top of me. 0    I have been so unhappy that I have had difficulty sleeping. 0    I have felt sad or miserable. 0    I have been so unhappy that I have been crying. 0    The thought of harming myself has occurred to me. 0    Edinburgh Postnatal Depression Scale Total 2             Health Maintenance Due  Topic Date Due   HPV VACCINES (1  - 2-dose series) Never done    The following portions of the patient's history were reviewed and updated as appropriate: allergies, current medications, past family history, past medical history, past social history, past surgical history, and problem list.  Review of Systems A comprehensive review of systems was negative.  Objective:  BP 102/70   Pulse (!) 109   Ht  (1.6 m)   Wt 171 lb (77.6 kg)   LMP 06/12/2022 (Exact Date)   Breastfeeding Yes   BMI 30.29 kg/m    General:  alert and no distress   Breasts:  normal  Lungs: clear to auscultation bilaterally  Heart:  regular rate and rhythm, S1, S2 normal, no murmur, click, rub or gallop  Abdomen: soft, non-tender; bowel sounds normal; no masses,  no organomegaly   Wound none  GU exam:  not indicated       Assessment:    1. Postpartum care following vaginal delivery  2. History of gestational hypertension - blood pressure is clinically stable - D/C Nifedipine  3. Encounter for other general counseling and advice on contraception - wants OCP's.  Options discussed - she is breast feeding  4. Encounter for initial prescription of contraceptive pills Rx: - POCT  urine pregnancy - norethindrone (MICRONOR) 0.35 MG tablet; Take 1 tablet (0.35 mg total) by mouth daily.  Dispense: 28 tablet; Refill: 11  5. Seasonal allergic rhinitis due to pollen Rx: - loratadine (CLARITIN) 10 MG tablet; Take 1 tablet (10 mg total) by mouth daily.  Dispense: 30 tablet; Refill: 11    Plan:   Essential components of care per ACOG recommendations:  1.  Mood and well being: Patient with negative depression screening today. Reviewed local resources for support.  - Patient tobacco use? No.   - hx of drug use? No.    2. Infant care and feeding:  -Patient currently breastmilk feeding? Yes. Discussed returning to work and pumping. Reviewed importance of draining breast regularly to support lactation.  -Social determinants of health (SDOH)  reviewed in EPIC. No concerns  3. Sexuality, contraception and birth spacing - Patient does not want a pregnancy in the next year.  Desired family size is 2 children.  - Reviewed reproductive life planning. Reviewed contraceptive methods based on pt preferences and effectiveness.  Patient desired Oral Contraceptive today.   - Discussed birth spacing of 18 months  4. Sleep and fatigue -Encouraged family/partner/community support of 4 hrs of uninterrupted sleep to help with mood and fatigue  5. Physical Recovery  - Discussed patients delivery and complications. She describes her labor as good. - Patient had a Vaginal, no problems at delivery. Patient had  no  laceration. Perineal healing reviewed. Patient expressed understanding - Patient has urinary incontinence? No. - Patient is safe to resume physical and sexual activity  6.  Health Maintenance - HM due items addressed Yes - Last pap smear  Diagnosis  Date Value Ref Range Status  10/26/2021   Final   - Negative for intraepithelial lesion or malignancy (NILM)   Pap smear not done at today's visit.  -Breast Cancer screening indicated? No.   7. Chronic Disease/Pregnancy Condition follow up: None   Charles A. Clearance Coots MD [facility], [facility], [facility] 06/20/22

## 2022-08-09 ENCOUNTER — Ambulatory Visit: Payer: Medicaid Other | Admitting: Obstetrics

## 2022-08-09 ENCOUNTER — Encounter: Payer: Self-pay | Admitting: Obstetrics

## 2022-08-09 DIAGNOSIS — Z3041 Encounter for surveillance of contraceptive pills: Secondary | ICD-10-CM

## 2022-08-09 MED ORDER — NORETHINDRONE 0.35 MG PO TABS: 1.0000 | ORAL_TABLET | Freq: Every day | ORAL | 11 refills | Status: DC

## 2022-08-09 NOTE — Progress Notes (Signed)
Subjective:    Paige Stevens is a 22 y.o. female who presents for contraception counseling. The patient has no complaints today. The patient is sexually active.  She is still breast feeding. Pertinent past medical history: none.  The information documented in the HPI was reviewed and verified.  Menstrual History: OB History     Gravida  2   Para  2   Term  2   Preterm      AB      Living  2      SAB      IAB      Ectopic      Multiple  0   Live Births  2            Patient's last menstrual period was 06/10/2022.   Patient Active Problem List   Diagnosis Date Noted   Post-dates pregnancy 05/09/2022   Gestational hypertension 05/09/2022   Rubella non-immune status, antepartum 05/07/2022   History of gestational hypertension 10/26/2021   Family history of thyroid cancer 11/13/2015   Past Medical History:  Diagnosis Date   Anemia    Anxiety    Depression    Hypertension     Past Surgical History:  Procedure Laterality Date   NO PAST SURGERIES       Current Outpatient Medications:    Prenatal Vit-Fe Fumarate-FA (PREPLUS) 27-1 MG TABS, Take by mouth., Disp: , Rfl:    acetaminophen (TYLENOL) 325 MG tablet, Take 2 tablets (650 mg total) by mouth every 4 (four) hours as needed (for pain scale < 4)., Disp: , Rfl:    ferrous sulfate 325 (65 FE) MG tablet, Take 1 tablet (325 mg total) by mouth every other day. (Patient not taking: Reported on 05/07/2022), Disp: 30 tablet, Rfl: 3   furosemide (LASIX) 20 MG tablet, Take 1 tablet (20 mg total) by mouth daily., Disp: 4 tablet, Rfl: 0   ibuprofen (ADVIL) 600 MG tablet, Take 1 tablet (600 mg total) by mouth every 6 (six) hours., Disp: 30 tablet, Rfl: 0   loratadine (CLARITIN) 10 MG tablet, Take 1 tablet (10 mg total) by mouth daily., Disp: 30 tablet, Rfl: 11   NIFEdipine (ADALAT CC) 30 MG 24 hr tablet, Take 1 tablet (30 mg total) by mouth daily., Disp: 90 tablet, Rfl: 0   norethindrone (MICRONOR) 0.35 MG  tablet, Take 1 tablet (0.35 mg total) by mouth daily., Disp: 28 tablet, Rfl: 11 Allergies  Allergen Reactions   Latex Rash    Per prenatal records from [place]    Social History   Tobacco Use   Smoking status: Never   Smokeless tobacco: Never  Substance Use Topics   Alcohol use: Not Currently    Comment: occ prior to pregnancy    Family History  Problem Relation Age of Onset   Hypothyroidism Mother    Hypertension Father    Diabetes Paternal Grandmother    Hypertension Paternal Grandmother    Hypertension Paternal Grandfather        Review of Systems Constitutional: negative for weight loss Genitourinary:negative for abnormal menstrual periods and vaginal discharge   Objective:   BP 136/80   Pulse 94   Ht 5\' 3"  (1.6 m)   Wt 171 lb (77.6 kg)   LMP 06/10/2022   BMI 30.29 kg/m    General:   Alert and no distress  Skin:   no rash or abnormalities  Lungs:   clear to auscultation bilaterally  Heart:   regular rate and  rhythm, S1, S2 normal, no murmur, click, rub or gallop  Breasts:   normal without suspicious masses, skin or nipple changes or axillary nodes  Abdomen:  normal findings: no organomegaly, soft, non-tender and no hernia  Pelvis:  External genitalia: normal general appearance Urinary system: urethral meatus normal and bladder without fullness, nontender Vaginal: normal without tenderness, induration or masses Cervix: normal appearance Adnexa: normal bimanual exam Uterus: anteverted and non-tender, normal size   Lab Review Urine pregnancy test Labs reviewed yes Radiologic studies reviewed no  .I have spent a total of 20 minutes of face-to-face time, excluding clinical staff time, reviewing notes and preparing to see patient, ordering tests and/or medications, and counseling the patient.   Assessment:    22 y.o., continuing oral progesterone-only contraceptive, no contraindications.   Plan:   1. Postpartum care following vaginal delivery - doing  well  2. Mother currently breast-feeding  3. Encounter for surveillance of contraceptive pills Rx: - norethindrone (MICRONOR) 0.35 MG tablet; Take 1 tablet (0.35 mg total) by mouth daily.  Dispense: 28 tablet; Refill: 11     All questions answered. Discussed healthy lifestyle modifications. Follow up in 3 months. Pregnancy test, result: negative.  Meds ordered this encounter  Medications   norethindrone (MICRONOR) 0.35 MG tablet    Sig: Take 1 tablet (0.35 mg total) by mouth daily.    Dispense:  28 tablet    Refill:  11    Brock Bad, MD 08/09/2022 11:19 AM

## 2022-08-09 NOTE — Progress Notes (Signed)
Pt presents for [facility] f/u. Pt requesting UPT today. LMP in April.

## 2022-10-12 ENCOUNTER — Encounter (HOSPITAL_COMMUNITY): Payer: Self-pay | Admitting: Urgent Care

## 2022-10-12 ENCOUNTER — Ambulatory Visit (HOSPITAL_COMMUNITY)
Admission: EM | Admit: 2022-10-12 | Discharge: 2022-10-12 | Disposition: A | Discharge status: Home / Self Care | Payer: Medicaid Other | Attending: Urgent Care | Admitting: Urgent Care

## 2022-10-12 DIAGNOSIS — H60333 Swimmer's ear, bilateral: Secondary | ICD-10-CM | POA: Diagnosis not present

## 2022-10-12 MED ORDER — CIPROFLOXACIN-DEXAMETHASONE 0.3-0.1 % OT SUSP: 4.0000 [drp] | Freq: Two times a day (BID) | OTIC | 0 refills | Status: AC

## 2022-10-12 NOTE — ED Provider Notes (Signed)
[facility]    CSN: [id number] Arrival date & time: 10/12/22  1005      History   Chief Complaint Chief Complaint  Patient presents with   Otalgia    HPI Paige Stevens is a 22 y.o. female.   Pleasant 22 year old female presents today due to concerns of bilateral external ear pain for the past 2 days.  Had otitis externa back in November, states symptoms feel similar.  She had extra eardrops, has been using them without improvement.  She was prescribed ofloxacin ophthalmic drops back in November.  Denies Q-tip use or recent swimming.  She is not diabetic.  Reports pain is much worse on the right, does have swelling and discharge from the ear.  States she was diagnosed with a ruptured eardrum back in November.  Denies a fever or any additional URI symptoms.    Otalgia   Past Medical History:  Diagnosis Date   Anemia    Anxiety    Depression    Hypertension     Patient Active Problem List   Diagnosis Date Noted   Post-dates pregnancy 05/09/2022   Gestational hypertension 05/09/2022   Rubella non-immune status, antepartum 05/07/2022   History of gestational hypertension 10/26/2021   Family history of thyroid cancer 11/13/2015    Past Surgical History:  Procedure Laterality Date   NO PAST SURGERIES      OB History     Gravida  2   Para  2   Term  2   Preterm      AB      Living  2      SAB      IAB      Ectopic      Multiple  0   Live Births  2            Home Medications    Prior to Admission medications   Medication Sig Start Date End Date Taking? Authorizing Provider  ciprofloxacin-dexamethasone (CIPRODEX) OTIC suspension Place 4 drops into both ears 2 (two) times daily for 7 days. 10/12/22 10/19/22 Yes Tristin Gladman L, PA  acetaminophen (TYLENOL) 325 MG tablet Take 2 tablets (650 mg total) by mouth every 4 (four) hours as needed (for pain scale < 4). 05/10/22   Cresenzo-Dishmon, Scarlette Calico, CNM  ferrous sulfate 325  (65 FE) MG tablet Take 1 tablet (325 mg total) by mouth every other day. Patient not taking: Reported on 05/07/2022 03/05/22   Adam Phenix, MD  norethindrone (MICRONOR) 0.35 MG tablet Take 1 tablet (0.35 mg total) by mouth daily. 08/09/22   Brock Bad, MD    Family History Family History  Problem Relation Age of Onset   Hypothyroidism Mother    Hypertension Father    Diabetes Paternal Grandmother    Hypertension Paternal Grandmother    Hypertension Paternal Grandfather     Social History Social History   Tobacco Use   Smoking status: Never   Smokeless tobacco: Never  Vaping Use   Vaping status: Never Used  Substance Use Topics   Alcohol use: Not Currently    Comment: occ prior to pregnancy   Drug use: Never     Allergies   Latex   Review of Systems Review of Systems  HENT:  Positive for ear pain.   As per HPI   Physical Exam Triage Vital Signs ED Triage Vitals  Encounter Vitals Group     BP 10/12/22 1031 114/68     Systolic BP  Percentile --      Diastolic BP Percentile --      Pulse Rate 10/12/22 1031 96     Resp 10/12/22 1031 14     Temp 10/12/22 1031 98.6 F (37 C)     Temp Source 10/12/22 1031 Oral     SpO2 10/12/22 1031 98 %     Weight --      Height --      Head Circumference --      Peak Flow --      Pain Score 10/12/22 1034 8     Pain Loc --      Pain Education --      Exclude from Growth Chart --    No data found.  Updated Vital Signs BP 114/68 (BP Location: Left Arm)   Pulse 96   Temp 98.6 F (37 C) (Oral)   Resp 14   SpO2 98%   Visual Acuity Right Eye Distance:   Left Eye Distance:   Bilateral Distance:    Right Eye Near:   Left Eye Near:    Bilateral Near:     Physical Exam Vitals and nursing note reviewed. Exam conducted with a chaperone present.  Constitutional:      General: She is not in acute distress.    Appearance: Normal appearance. She is well-developed and normal weight. She is not ill-appearing,  toxic-appearing or diaphoretic.  HENT:     Head: Normocephalic and atraumatic.     Right Ear: Tympanic membrane and external ear normal. Drainage, swelling and tenderness present. There is no impacted cerumen.     Left Ear: Ear canal and external ear normal. Drainage (scant) and swelling present. No tenderness. There is no impacted cerumen. Tympanic membrane is scarred. Tympanic membrane is not injected, perforated or erythematous.     Ears:     Comments: Tenderness to tragus and movement of helix Unable to visualize TM due to edema and debris of R EAC    Nose: Nose normal. No congestion or rhinorrhea.     Mouth/Throat:     Mouth: Mucous membranes are moist.     Pharynx: Oropharynx is clear. No oropharyngeal exudate.  Eyes:     General: No scleral icterus.       Right eye: No discharge.        Left eye: No discharge.     Extraocular Movements: Extraocular movements intact.     Conjunctiva/sclera: Conjunctivae normal.     Pupils: Pupils are equal, round, and reactive to light.  Cardiovascular:     Rate and Rhythm: Normal rate and regular rhythm.     Heart sounds: No murmur heard. Pulmonary:     Effort: Pulmonary effort is normal. No respiratory distress.     Breath sounds: Normal breath sounds.  Abdominal:     Palpations: Abdomen is soft.     Tenderness: There is no abdominal tenderness.  Musculoskeletal:        General: No swelling.     Cervical back: Normal range of motion and neck supple. No rigidity or tenderness.  Lymphadenopathy:     Cervical: No cervical adenopathy.  Skin:    General: Skin is warm and dry.     Capillary Refill: Capillary refill takes less than 2 seconds.  Neurological:     Mental Status: She is alert.  Psychiatric:        Mood and Affect: Mood normal.      UC Treatments / Results  Labs (all labs ordered  are listed, but only abnormal results are displayed) Labs Reviewed - No data to display  EKG   Radiology No results  found.  Procedures Procedures (including critical care time)  Medications Ordered in UC Medications - No data to display  Initial Impression / Assessment and Plan / UC Course  I have reviewed the triage vital signs and the nursing notes.  Pertinent labs & imaging results that were available during my care of the patient were reviewed by me and considered in my medical decision making (see chart for details).     B OE -bilateral otitis externa noted, right side is significantly worse than the left.  I did place an ear wick in the right to allow better penetration of the drops.  Patient to stop the ofloxacin ophthalmic drops prescribed in November, will switch to Ciprodex otic drops prescribed today.  Ibuprofen and/or Tylenol as needed for pain and swelling.  Did discuss proper administration techniques.  Patient to do for 7 days, but may extend to 10 days on right if needed.  Return to clinic precautions discussed.  Final Clinical Impressions(s) / UC Diagnoses   Final diagnoses:  Acute swimmer's ear of both sides     Discharge Instructions      Your ear pain is due to otitis externa, which is an infection of the ear canal. Avoid using Q-tips or anything inside the ear. Clean off your ear buds (if you use them) extensively with antiseptic wipes, and avoid placing back in the ear until fully treated. Do not go swimming or submerge head in water x1 week.  Place the eardrops in both ears, 4 drops twice daily for 1 week. Can extend for 10 days on the R if still symptomatic. Tilt head to the side for 10 minutes after administration of drops. You have an ear wick on the Right. This will call out once the swelling of the ear canal resolves. You do not need to remove it yourself.  Take OTC ibuprofen or tylenol as needed for pain/ swelling. Follow-up with PCP should symptoms persist or worsen      ED Prescriptions     Medication Sig Dispense Auth. Provider    ciprofloxacin-dexamethasone (CIPRODEX) OTIC suspension Place 4 drops into both ears 2 (two) times daily for 7 days. 7.5 mL Siedah Sedor L, PA      PDMP not reviewed this encounter.   Maretta Bees, [place] 10/12/22 1104

## 2022-10-12 NOTE — ED Triage Notes (Signed)
Patient reports that she has bilateral ear pain R>L. Patient states the pain radiates from the right ear to her right jaw area.  Patient states she was seen in January for the same and had leftover ear gtts and has been using with no relief.

## 2022-10-12 NOTE — Discharge Instructions (Addendum)
Your ear pain is due to otitis externa, which is an infection of the ear canal. Avoid using Q-tips or anything inside the ear. Clean off your ear buds (if you use them) extensively with antiseptic wipes, and avoid placing back in the ear until fully treated. Do not go swimming or submerge head in water x1 week.  Place the eardrops in both ears, 4 drops twice daily for 1 week. Can extend for 10 days on the R if still symptomatic. Tilt head to the side for 10 minutes after administration of drops. You have an ear wick on the Right. This will call out once the swelling of the ear canal resolves. You do not need to remove it yourself.  Take OTC ibuprofen or tylenol as needed for pain/ swelling. Follow-up with PCP should symptoms persist or worsen

## 2022-12-05 ENCOUNTER — Telehealth: Payer: Self-pay

## 2022-12-05 NOTE — Telephone Encounter (Signed)
Patient called requesting that her ocp pills be changed to combined pills because she is no longer breastfeeding.  Please advise for prescription change

## 2022-12-09 ENCOUNTER — Other Ambulatory Visit: Payer: Self-pay | Admitting: Obstetrics and Gynecology

## 2022-12-09 MED ORDER — NORETHIN ACE-ETH ESTRAD-FE 1-20 MG-MCG PO TABS
1.0000 | ORAL_TABLET | Freq: Every day | ORAL | 3 refills | Status: DC
Start: 2022-12-09 — End: 2023-03-07

## 2023-01-15 ENCOUNTER — Ambulatory Visit: Payer: Medicaid Other | Admitting: Obstetrics and Gynecology

## 2023-03-07 ENCOUNTER — Encounter: Payer: Self-pay | Admitting: Obstetrics and Gynecology

## 2023-03-07 ENCOUNTER — Other Ambulatory Visit (HOSPITAL_COMMUNITY)
Admission: RE | Admit: 2023-03-07 | Discharge: 2023-03-07 | Disposition: A | Discharge status: Home / Self Care | Payer: Medicaid Other | Source: Ambulatory Visit | Attending: Obstetrics and Gynecology | Admitting: Obstetrics and Gynecology

## 2023-03-07 ENCOUNTER — Ambulatory Visit: Payer: Medicaid Other | Admitting: Obstetrics and Gynecology

## 2023-03-07 VITALS — BP 130/84 | HR 101 | Wt 179.6 lb

## 2023-03-07 DIAGNOSIS — Z3202 Encounter for pregnancy test, result negative: Secondary | ICD-10-CM

## 2023-03-07 DIAGNOSIS — Z30430 Encounter for insertion of intrauterine contraceptive device: Secondary | ICD-10-CM | POA: Diagnosis not present

## 2023-03-07 DIAGNOSIS — Z113 Encounter for screening for infections with a predominantly sexual mode of transmission: Secondary | ICD-10-CM | POA: Insufficient documentation

## 2023-03-07 DIAGNOSIS — Z975 Presence of (intrauterine) contraceptive device: Secondary | ICD-10-CM

## 2023-03-07 LAB — POCT URINE PREGNANCY: Preg Test, Ur: NEGATIVE

## 2023-03-07 MED ORDER — LEVONORGESTREL 20 MCG/DAY IU IUD
1.0000 | INTRAUTERINE_SYSTEM | Freq: Once | INTRAUTERINE | Status: AC
Start: 2023-03-07 — End: 2023-03-07
  Administered 2023-03-07: 1 via INTRAUTERINE

## 2023-03-07 NOTE — Progress Notes (Signed)
    GYNECOLOGY OFFICE PROCEDURE NOTE  Sierah Lacewell is a 23 y.o. [phone] here for IUD insertion. No GYN concerns.  Last pap smear was on 10/26/21 and was normal.  IUD Insertion Procedure Note Procedure: IUD insertion with Mirena  UPT: Negative GC/CT testing: Offered and accepts  Patient identified.  Risks, benefits and alternatives of procedure were discussed including irregular bleeding, cramping, infection, malpositioning or misplacement of the IUD outside the uterus which may require further procedure such as laparoscopy. Also discussed >99% contraception efficacy, increased risk of ectopic pregnancy with failure of method.   Emphasized that this did not protect against STIs, condoms recommended during all sexual encounters. Consent signed. Time out performed.   Speculum inserted and cervix visualized, prepped with 3 swabs of betadine.  Grasped with a single tooth tenaculum. , Uterus sounded to 8.5 cm. , Cervical dilators used. , and IUD then inserted without difficulty per manufacturer's instructions and strings cut to 3 cm below cervical os and all instruments removed. Pt tolerated well with some pain and minimal bleeding.   Discussed concerning signs/symptoms and to call if heavy bleeding, severe abdominal pain, or fever in the following 3 weeks. Manufacturer pamphlet/patient information given. Reviewed timing of efficacy for contraception and to use an alternative form of birth control until that time.  Kieth Carolin, MD Obstetrician & Gynecologist, [facility] for [employer], [facility]

## 2023-03-07 NOTE — Progress Notes (Signed)
 Pt presents for IUD insertion. UPT negative

## 2023-03-07 NOTE — Patient Instructions (Signed)
 Take ibuprofen 600mg  every 6 hours for pain for the next 2-3 days. You can also take tylenol 1000mg  every 6-8 hours at the same time if needed

## 2023-03-10 LAB — CERVICOVAGINAL ANCILLARY ONLY
Chlamydia: NEGATIVE
Comment: NEGATIVE
Comment: NORMAL
Neisseria Gonorrhea: NEGATIVE

## 2023-03-26 ENCOUNTER — Encounter: Payer: Self-pay | Admitting: Obstetrics and Gynecology

## 2023-04-21 ENCOUNTER — Ambulatory Visit (INDEPENDENT_AMBULATORY_CARE_PROVIDER_SITE_OTHER): Payer: Medicaid Other | Admitting: Podiatry

## 2023-04-21 ENCOUNTER — Encounter: Payer: Self-pay | Admitting: Podiatry

## 2023-04-21 DIAGNOSIS — B351 Tinea unguium: Secondary | ICD-10-CM | POA: Diagnosis not present

## 2023-04-21 MED ORDER — KETOCONAZOLE 2 % EX CREA: 1.0000 | TOPICAL_CREAM | Freq: Every day | CUTANEOUS | 2 refills | Status: DC

## 2023-04-21 NOTE — Progress Notes (Addendum)
  Subjective:  Patient ID: Paige Stevens, female    DOB: 2001-01-19,   MRN: [id number]  Chief Complaint  Patient presents with   Nail Problem    23 y.o. female presents for concern of changes to her nails and her skin that started after her son was born about 3 years ago. Relates the nails have been thick and discolored and starting to spread to more nails. Also relates some dryness and itchiness in her feet especially between her toes. She denies any current treatments   . Denies any other pedal complaints. Denies n/v/f/c.   Past Medical History:  Diagnosis Date   Anemia    Anxiety    Depression    Hypertension     Objective:  Physical Exam: Vascular: DP/PT pulses 2/4 bilateral. CFT <3 seconds. Normal hair growth on digits. No edema.  Skin. No lacerations or abrasions bilateral feet. Bilateral great toenails discolored and thickened with subungual debris. Thickness noted to right second and left fifth digit nail as well.  Musculoskeletal: MMT 5/5 bilateral lower extremities in DF, PF, Inversion and Eversion. Deceased ROM in DF of ankle joint.  Neurological: Sensation intact to light touch.   Assessment:   1. Onychomycosis      Plan:  Patient was evaluated and treated and all questions answered. -Examined patient -Discussed treatment options for painful dystrophic nails and tinea pedis  -Ketoconazole sent for tinea -Clinical picture and Fungal culture was obtained by removing a portion of the hard nail itself from each of the involved toenails using a sterile nail nipper and sent to [facility] lab. Patient tolerated the biopsy procedure well without discomfort or need for anesthesia.  -Discussed fungal nail treatment options including oral, topical, and laser treatments.  -Patient to return in 4 weeks for follow up evaluation and discussion of fungal culture results or sooner if symptoms worsen.   Louann Sjogren, DPM

## 2023-04-21 NOTE — Addendum Note (Signed)
Addended by: Daryel November on: 04/21/2023 04:24 PM   Modules accepted: Orders

## 2023-04-24 ENCOUNTER — Ambulatory Visit: Payer: Medicaid Other

## 2023-04-28 ENCOUNTER — Other Ambulatory Visit: Payer: Self-pay | Admitting: Podiatry

## 2023-05-19 ENCOUNTER — Ambulatory Visit (INDEPENDENT_AMBULATORY_CARE_PROVIDER_SITE_OTHER): Payer: Medicaid Other | Admitting: Podiatry

## 2023-05-19 DIAGNOSIS — B351 Tinea unguium: Secondary | ICD-10-CM

## 2023-05-19 MED ORDER — TERBINAFINE HCL 250 MG PO TABS: 250.0000 mg | ORAL_TABLET | Freq: Every day | ORAL | 2 refills | Status: DC

## 2023-05-19 MED ORDER — TERBINAFINE HCL 250 MG PO TABS: 250.0000 mg | ORAL_TABLET | Freq: Every day | ORAL | 2 refills | Status: AC

## 2023-05-19 NOTE — Progress Notes (Signed)
  Subjective:  Patient ID: Paige Stevens, female    DOB: 02-Dec-2000,   MRN: [id number]  No chief complaint on file.   23 y.o. female presents for follow-up of fungal nails and to discuss culture results.  . Denies any other pedal complaints. Denies n/v/f/c.   Past Medical History:  Diagnosis Date   Anemia    Anxiety    Depression    Hypertension     Objective:  Physical Exam: Vascular: DP/PT pulses 2/4 bilateral. CFT <3 seconds. Normal hair growth on digits. No edema.  Skin. No lacerations or abrasions bilateral feet. Bilateral great toenails discolored and thickened with subungual debris. Thickness noted to right second and left fifth digit nail as well.  Musculoskeletal: MMT 5/5 bilateral lower extremities in DF, PF, Inversion and Eversion. Deceased ROM in DF of ankle joint.  Neurological: Sensation intact to light touch.   Assessment:   1. Onychomycosis       Plan:  Patient was evaluated and treated and all questions answered. -Examined patient -Discussed treatment options for painful dystrophic nails and tinea pedis  -Ketoconazole sent for tinea -Culture positive for fungus.  -Discussed fungal nail treatment options including oral, topical, and laser treatments.  Opts to go with lamisil.  LFTS normal limits wiill send to pharmacy  -Patient to return in 3 months.    Louann Sjogren, DPM

## 2023-07-06 ENCOUNTER — Ambulatory Visit
Admission: EM | Admit: 2023-07-06 | Discharge: 2023-07-06 | Disposition: A | Discharge status: Home / Self Care | Attending: Physician Assistant | Admitting: Physician Assistant

## 2023-07-06 ENCOUNTER — Other Ambulatory Visit: Payer: Self-pay

## 2023-07-06 DIAGNOSIS — J069 Acute upper respiratory infection, unspecified: Secondary | ICD-10-CM | POA: Diagnosis not present

## 2023-07-06 LAB — POC COVID19/FLU A&B COMBO
Covid Antigen, POC: NEGATIVE
Influenza A Antigen, POC: NEGATIVE
Influenza B Antigen, POC: NEGATIVE

## 2023-07-06 LAB — POCT RAPID STREP A (OFFICE): Rapid Strep A Screen: NEGATIVE

## 2023-07-06 NOTE — ED Triage Notes (Addendum)
 Pt presents with complaints of sharp, painful cough, runny nose, sore throat, bilateral ear fullness, generalized body aches, and low-grade fevers x 3 days. Temperature was 100.5 F Friday night 5/2. Pt currently rates her overall pain a 4/10. OTC Dayquil taken with some relief/improvement in symptoms. Pt's child is sick with similar symptoms.

## 2023-07-06 NOTE — ED Provider Notes (Signed)
 [facility]    CSN: [id number] Arrival date & time: 07/06/23  1102      History   Chief Complaint Chief Complaint  Patient presents with   Cough    HPI Sareena Shavana Chew is a 23 y.o. female.   HPI  She reports that she started to get symptom on Thursday night She reports having nasal congestion, fever, body aches, chills She reports coughing, ear pressure and sinus pain as well  Her son was sick with similar symptoms earlier in the week and is still recovering   Interventions: She has been taking Dayquil cold    Past Medical History:  Diagnosis Date   Anemia    Anxiety    Depression    Hypertension     Patient Active Problem List   Diagnosis Date Noted   Gestational hypertension 05/09/2022   History of gestational hypertension 10/26/2021   Family history of thyroid  cancer 11/13/2015    Past Surgical History:  Procedure Laterality Date   NO PAST SURGERIES      OB History     Gravida  2   Para  2   Term  2   Preterm      AB      Living  2      SAB      IAB      Ectopic      Multiple  0   Live Births  2            Home Medications    Prior to Admission medications   Medication Sig Start Date End Date Taking? Authorizing Provider  ketoconazole  (NIZORAL ) 2 % cream Apply 1 Application topically daily. 04/21/23   Sikora, Rebecca, DPM  terbinafine  (LAMISIL ) 250 MG tablet Take 1 tablet (250 mg total) by mouth daily. 05/19/23 08/17/23  Jennefer Moats, DPM    Family History Family History  Problem Relation Age of Onset   Hypothyroidism Mother    Hypertension Father    Diabetes Paternal Grandmother    Hypertension Paternal Grandmother    Hypertension Paternal Grandfather     Social History Social History   Tobacco Use   Smoking status: Never   Smokeless tobacco: Never  Vaping Use   Vaping status: Never Used  Substance Use Topics   Alcohol use: Not Currently    Comment: occ prior to pregnancy   Drug  use: Never     Allergies   Latex   Review of Systems Review of Systems  Constitutional:  Positive for chills and fever.  HENT:  Positive for congestion, ear pain, rhinorrhea, sinus pain and sore throat.   Eyes:  Positive for discharge and itching.  Respiratory:  Positive for cough. Negative for shortness of breath and wheezing.   Gastrointestinal:  Negative for diarrhea, nausea and vomiting.  Musculoskeletal:  Positive for myalgias.  Neurological:  Positive for headaches.     Physical Exam Triage Vital Signs ED Triage Vitals  Encounter Vitals Group     BP 07/06/23 1123 125/82     Systolic BP Percentile --      Diastolic BP Percentile --      Pulse Rate 07/06/23 1123 87     Resp 07/06/23 1123 16     Temp 07/06/23 1123 98.3 F (36.8 C)     Temp Source 07/06/23 1123 Oral     SpO2 07/06/23 1123 97 %     Weight 07/06/23 1123 179 lb 10.8 oz (81.5 kg)  Height 07/06/23 1123 5\' 3"  (1.6 m)     Head Circumference --      Peak Flow --      Pain Score 07/06/23 1121 4     Pain Loc --      Pain Education --      Exclude from Growth Chart --    No data found.  Updated Vital Signs BP 125/82 (BP Location: Right Arm)   Pulse 87   Temp 98.3 F (36.8 C) (Oral)   Resp 16   Ht 5\' 3"  (1.6 m)   Wt 179 lb 10.8 oz (81.5 kg)   LMP 06/17/2023 (Exact Date)   SpO2 97%   Breastfeeding No   BMI 31.83 kg/m   Visual Acuity Right Eye Distance:   Left Eye Distance:   Bilateral Distance:    Right Eye Near:   Left Eye Near:    Bilateral Near:     Physical Exam Vitals reviewed.  Constitutional:      General: She is awake.     Appearance: Normal appearance. She is well-developed and well-groomed.  HENT:     Head: Normocephalic and atraumatic.     Right Ear: Hearing, tympanic membrane and ear canal normal.     Left Ear: Hearing, tympanic membrane and ear canal normal.     Mouth/Throat:     Lips: Pink.     Mouth: Mucous membranes are moist.     Pharynx: Oropharynx is clear.  Uvula midline. No pharyngeal swelling, oropharyngeal exudate, posterior oropharyngeal erythema, uvula swelling or postnasal drip.  Cardiovascular:     Rate and Rhythm: Normal rate and regular rhythm.     Pulses: Normal pulses.          Radial pulses are 2+ on the right side and 2+ on the left side.     Heart sounds: Normal heart sounds. No murmur heard.    No friction rub. No gallop.  Pulmonary:     Effort: Pulmonary effort is normal.     Breath sounds: Normal breath sounds. No decreased air movement. No decreased breath sounds, wheezing, rhonchi or rales.  Musculoskeletal:     Cervical back: Normal range of motion and neck supple.  Lymphadenopathy:     Head:     Right side of head: No submental, submandibular or preauricular adenopathy.     Left side of head: No submental, submandibular or preauricular adenopathy.     Cervical:     Right cervical: No superficial cervical adenopathy.    Left cervical: No superficial cervical adenopathy.     Upper Body:     Right upper body: No supraclavicular adenopathy.     Left upper body: No supraclavicular adenopathy.  Skin:    General: Skin is warm and dry.  Neurological:     General: No focal deficit present.     Mental Status: She is alert and oriented to person, place, and time.  Psychiatric:        Mood and Affect: Mood normal.        Behavior: Behavior normal. Behavior is cooperative.        Thought Content: Thought content normal.        Judgment: Judgment normal.      UC Treatments / Results  Labs (all labs ordered are listed, but only abnormal results are displayed) Labs Reviewed  POC COVID19/FLU A&B COMBO  POCT RAPID STREP A (OFFICE)    EKG   Radiology No results found.  Procedures Procedures (including critical  care time)  Medications Ordered in UC Medications - No data to display  Initial Impression / Assessment and Plan / UC Course  I have reviewed the triage vital signs and the nursing notes.  Pertinent  labs & imaging results that were available during my care of the patient were reviewed by me and considered in my medical decision making (see chart for details).      Final Clinical Impressions(s) / UC Diagnoses   Final diagnoses:  Viral upper respiratory tract infection   Visit with patient indicates symptoms comprised of cough, runny nose, sore throat, bilateral ear fullness, generalized bodyaches and low fevers for the past 3 days congruent with acute URI that is likely viral in nature  Rapid flu, COVID, strep test were negative.  These results were discussed with patient during appointment today.  Due to nature and duration of symptoms recommended treatment regimen is symptomatic relief and follow up if needed Discussed with patient the various viral and bacterial etiologies of current illness and appropriate course of treatment  Discussed likely viral etiology given short duration.  No evidence of acute bacterial infection on physical exam that would warrant initiation of antibiotics.  Recommend over-the-counter medications for further symptomatic relief.  Patient is to rest and drink plenty of fluid.  Discussed alarm symptoms that warrant emergent evaluation including high fever not responding to medication, nausea/vomiting interfering with oral intake, worsening cough, chest pain, shortness of breath.  Strict return precautions given to which patient expressed understanding.    Discharge Instructions      Based on your described symptoms and the duration of symptoms it is likely that you have a viral upper respiratory infection (often called a "cold")  Symptoms can last for 3-10 days with lingering cough and intermittent symptoms potentially  lasting several  weeks after that.  The goal of treatment at this time is to reduce your symptoms and discomfort   You can use the following medications and measures to help yourself feel better until your body fights this  off: DayQuil/NyQuil, TheraFlu, Alka-Seltzer  (these medications typically have the same active ingredients in them so you can choose whichever one you prefer and take consistently during the day and night according to the manufactures instructions.) Flonase A daily antihistamine such as Zyrtec, Claritin , Allegra  per your preference.  Please choose 1 and take consistently. Increased fluids.  It is recommended that you take in at least 64 ounces of water per day when you are not sick so it is important to increase this when you are sick and your body may be running fever. Rest Cough drops Chloraseptic throat spray to help with sore throat Nasal saline spray or nasal flushes to help with congestion and runny nose  If your symptoms seem like they are getting worse over the next 5 to 7 days or not improving you can always follow-up here in urgent care or go to your primary care provider for further management. Go to the ER if you begin to have more serious symptoms such as shortness of breath, trouble breathing, loss of consciousness, swelling around the eyes, high fever, severe lasting headaches, vision changes or neck pain/stiffness.       ED Prescriptions   None    PDMP not reviewed this encounter.   Jerona Mooring, PA-C 07/06/23 1651

## 2023-07-06 NOTE — Discharge Instructions (Signed)

## 2023-08-20 ENCOUNTER — Ambulatory Visit: Admitting: Podiatry

## 2023-08-21 ENCOUNTER — Other Ambulatory Visit: Payer: Self-pay

## 2023-08-21 ENCOUNTER — Ambulatory Visit

## 2023-08-21 ENCOUNTER — Ambulatory Visit
Admission: RE | Admit: 2023-08-21 | Discharge: 2023-08-21 | Disposition: A | Discharge status: Home / Self Care | Source: Ambulatory Visit

## 2023-08-21 VITALS — BP 118/70 | HR 88 | Temp 98.4°F | Resp 18 | Ht 63.0 in | Wt 192.0 lb

## 2023-08-21 DIAGNOSIS — H00015 Hordeolum externum left lower eyelid: Secondary | ICD-10-CM

## 2023-08-21 DIAGNOSIS — H5789 Other specified disorders of eye and adnexa: Secondary | ICD-10-CM | POA: Diagnosis not present

## 2023-08-21 MED ORDER — ERYTHROMYCIN 5 MG/GM OP OINT: TOPICAL_OINTMENT | Freq: Four times a day (QID) | OPHTHALMIC | 0 refills | Status: AC

## 2023-08-21 NOTE — Discharge Instructions (Addendum)
 You were seen today for concerns of eye swelling and drainage  It appears that you may have a stye on your eyelid but I am concerned that you may have an infection so I am sending in an antibiotic eye ointment  to help manage this  To help resolve the stye, please try warm compresses to the area several times per day. This should help with drainage and reduce the swelling For the eye ointment, please instill a half inch ribbon into the affected eye every 6 hours for 7 days.  If you notice increased swelling, drainage, vision changes, fever or chills please return to urgent care or go to the ED for further evaluation

## 2023-08-21 NOTE — ED Provider Notes (Signed)
 [facility]    CSN: [id number] Arrival date & time: 08/21/23  1356      History   Chief Complaint Chief Complaint  Patient presents with   Eye Problem    Entered by patient    HPI [name] is a 23 y.o. female.   HPI  Pt reports concerns for left lower eyelid swelling and discharge She reports the pain started last night and has continued today. She reports pain with blinking She states that her eye is itching and she is having drainage from it   She has tried OTC anti-itch eye drops to help but this is not providing much relief   Past Medical History:  Diagnosis Date   Anemia    Anxiety    Depression    Hypertension     Patient Active Problem List   Diagnosis Date Noted   Gestational hypertension 05/09/2022   History of gestational hypertension 10/26/2021   Family history of thyroid  cancer 11/13/2015    Past Surgical History:  Procedure Laterality Date   NO PAST SURGERIES      OB History     Gravida  2   Para  2   Term  2   Preterm      AB      Living  2      SAB      IAB      Ectopic      Multiple  0   Live Births  2            Home Medications    Prior to Admission medications   Medication Sig Start Date End Date Taking? Authorizing Provider  escitalopram (LEXAPRO) 10 MG tablet Take 10 mg by mouth daily. 08/19/23  Yes [provider]  ketoconazole  (NIZORAL ) 2 % cream Apply 1 Application topically daily. 04/21/23   Jennefer Moats, DPM    Family History Family History  Problem Relation Age of Onset   Hypothyroidism Mother    Hypertension Father    Diabetes Paternal Grandmother    Hypertension Paternal Grandmother    Hypertension Paternal Grandfather     Social History Social History   Tobacco Use   Smoking status: Never   Smokeless tobacco: Never  Vaping Use   Vaping status: Never Used  Substance Use Topics   Alcohol use: Not Currently    Comment: occ prior to pregnancy    Drug use: Never     Allergies   Latex   Review of Systems Review of Systems  Constitutional:  Negative for chills and fever.  Eyes:  Positive for discharge and itching. Negative for photophobia and visual disturbance.     Physical Exam Triage Vital Signs ED Triage Vitals  Encounter Vitals Group     BP 08/21/23 1404 118/70     Girls Systolic BP Percentile --      Girls Diastolic BP Percentile --      Boys Systolic BP Percentile --      Boys Diastolic BP Percentile --      Pulse Rate 08/21/23 1404 88     Resp 08/21/23 1404 18     Temp 08/21/23 1404 98.4 F (36.9 C)     Temp Source 08/21/23 1404 Oral     SpO2 08/21/23 1404 96 %     Weight 08/21/23 1404 192 lb (87.1 kg)     Height 08/21/23 1404 5' 3 (1.6 m)     Head Circumference --  Peak Flow --      Pain Score 08/21/23 1417 5     Pain Loc --      Pain Education --      Exclude from Growth Chart --    No data found.  Updated Vital Signs BP 118/70 (BP Location: Right Arm)   Pulse 88   Temp 98.4 F (36.9 C) (Oral)   Resp 18   Ht 5' 3 (1.6 m)   Wt 192 lb (87.1 kg)   SpO2 96%   Breastfeeding No   BMI 34.01 kg/m   Visual Acuity Right Eye Distance:   Left Eye Distance:   Bilateral Distance:    Right Eye Near:   Left Eye Near:    Bilateral Near:     Physical Exam Vitals reviewed.  Constitutional:      General: She is awake.     Appearance: Normal appearance. She is well-developed and well-groomed.  HENT:     Head: Normocephalic and atraumatic.   Eyes:     General: Lids are normal. Gaze aligned appropriately.        Left eye: Hordeolum present.    Extraocular Movements: Extraocular movements intact.     Right eye: Normal extraocular motion and no nystagmus.     Left eye: Normal extraocular motion and no nystagmus.     Conjunctiva/sclera: Conjunctivae normal.     Left eye: Left conjunctiva is not injected. No chemosis, exudate or hemorrhage.    Pupils: Pupils are equal, round, and reactive to  light.   Pulmonary:     Effort: Pulmonary effort is normal.   Neurological:     Mental Status: She is alert and oriented to person, place, and time.   Psychiatric:        Attention and Perception: Attention and perception normal.        Mood and Affect: Mood and affect normal.        Speech: Speech normal.        Behavior: Behavior normal. Behavior is cooperative.      [facility] Treatments / Results  Labs (all labs ordered are listed, but only abnormal results are displayed) Labs Reviewed - No data to display  EKG   Radiology No results found.  Procedures Procedures (including critical care time)  Medications Ordered in [facility] Medications - No data to display  Initial Impression / Assessment and Plan / [facility] Course  I have reviewed the triage vital signs and the nursing notes.  Pertinent labs & imaging results that were available during my care of the patient were reviewed by me and considered in my medical decision making (see chart for details).      Final Clinical Impressions(s) / [facility] Diagnoses   Final diagnoses:  None   Discharge Instructions   None    ED Prescriptions   None    PDMP not reviewed this encounter.

## 2023-08-21 NOTE — ED Triage Notes (Signed)
 Pt presents with complaints of left eye swelling (lower eyelid) that began last night, 6/18. Pt states there is discharge coming out of eye and it is itching. OTC anti-itch eye drops applied with little relief. Pt currently rates her overall pain a 5/10. Hurts to blink.

## 2023-09-28 NOTE — Progress Notes (Unsigned)
 GYNECOLOGY  VISIT   HPI: Paige Stevens is a 23 y.o.  female [phone] here for 8/10 soreness and discomfort on the left side of her pelvis that began last month. Episode of pain during intercourse recently that lasted through next day. This has never happened to her before IUD insertion in 7 months ago. Would like IUD assessed. Has continued to have monthly periods with Mirena  IUD, last bleeding with duration of two weeks. Patient denies fever, chills, n/v.   GYNECOLOGIC HISTORY: No LMP recorded. (Menstrual status: IUD). Contraception: Mirena  IUD inserted 03/07/23 Menopausal hormone therapy: premenopausal Last mammogram:  Never previously done due to age Last pap smear:  Diagnosis  Date Value Ref Range Status  10/26/2021   Final   - Negative for intraepithelial lesion or malignancy (NILM)           OB History     Gravida  2   Para  2   Term  2   Preterm      AB      Living  2      SAB      IAB      Ectopic      Multiple  0   Live Births  2              Patient Active Problem List   Diagnosis Date Noted   Gestational hypertension 05/09/2022   History of gestational hypertension 10/26/2021   Family history of thyroid  cancer 11/13/2015    Past Medical History:  Diagnosis Date   Anemia    Anxiety    Depression    Hypertension     Past Surgical History:  Procedure Laterality Date   NO PAST SURGERIES      Current Outpatient Medications  Medication Sig Dispense Refill   escitalopram (LEXAPRO) 10 MG tablet Take 10 mg by mouth daily.     ketoconazole  (NIZORAL ) 2 % cream Apply 1 Application topically daily. 60 g 2   No current facility-administered medications for this visit.     ALLERGIES: Latex  Family History  Problem Relation Age of Onset   Hypothyroidism Mother    Hypertension Father    Diabetes Paternal Grandmother    Hypertension Paternal Grandmother    Hypertension Paternal Grandfather     Social History   Socioeconomic  History   Marital status: Single    Spouse name: Social worker   Number of children: Not on file   Years of education: Not on file   Highest education level: Not on file  Occupational History   Not on file  Tobacco Use   Smoking status: Never   Smokeless tobacco: Never  Vaping Use   Vaping status: Never Used  Substance and Sexual Activity   Alcohol use: Not Currently    Comment: occ prior to pregnancy   Drug use: Never   Sexual activity: Yes    Partners: Male    Birth control/protection: I.U.D.  Other Topics Concern   Not on file  Social History Narrative   Not on file   Social Drivers of Health   Financial Resource Strain: Not on File (06/21/2021)   Received from [employer]    Financial Resource Strain: 0  Food Insecurity: Not at Risk (04/09/2023)   Received from [facility] Insecurity    Within the past 12 months, you worried that your food would run out before you got money to buy more.: 1  Transportation Needs: Not  at Risk (04/09/2023)   Received from [facility] Needs    In the past 12 months, has lack of transportation kept you from medical appointments, meetings, work or from getting things needed for daily living?: 1  Physical Activity: Not on File (06/21/2021)   Received from [facility]   Physical Activity    Physical Activity: 0  Stress: Not on File (06/21/2021)   Received from [facility]   Stress    Stress: 0  Social Connections: Not on File (11/11/2022)   Received from [employer] [facility]   Social Connections    Connectedness: 0  Intimate Partner Violence: Not on file    Review of Systems  PHYSICAL EXAMINATION:    There were no vitals taken for this visit.    General appearance: alert, cooperative and appears stated age Head: Normocephalic, without obvious abnormality, atraumatic Neck: no adenopathy, supple, symmetrical, trachea midline and thyroid  normal to inspection and palpation Lungs: clear to auscultation bilaterally Heart: regular rate and  rhythm Abdomen: soft, non-tender, no masses,  no organomegaly Extremities: extremities normal, atraumatic, no cyanosis or edema Skin: Skin color, texture, turgor normal. No rashes or lesions Lymph nodes: Cervical, supraclavicular, and axillary nodes normal. No abnormal inguinal nodes palpated Neurologic: Grossly normal  Pelvic: External genitalia:  no lesions              Urethra:  normal appearing urethra with no masses, tenderness or lesions              Bartholins and Skenes: normal                 Vagina: normal appearing vagina with normal color and discharge, no lesions              Cervix: +IUD strings visible, ends curled into cervical os.  Chaperone was present for exam  ASSESSMENT & PLAN  1. IUD check up (Primary) Patient stable without signs of infection. Correct placement unclear, as ends of strings curled into cervix. Offered to remove IUD today with alternative method of contraception. Patient would like to keep it until there is more information about placement. I advised that she use analgesics for cramping in the meantime or RTC earlier to have it removed. Advised to use backup method for contraception for now. UPT negative.  - US  PELVIC COMPLETE WITH TRANSVAGINAL; Future - Cervicovaginal ancillary only - POCT urine pregnancy   An After Visit Summary was printed and given to the patient.  Paige Stevens E Kerstie Agent, [place] 7/27/20259:34 PM

## 2023-09-29 ENCOUNTER — Ambulatory Visit (INDEPENDENT_AMBULATORY_CARE_PROVIDER_SITE_OTHER): Admitting: Podiatry

## 2023-09-29 DIAGNOSIS — Z91199 Patient's noncompliance with other medical treatment and regimen due to unspecified reason: Secondary | ICD-10-CM

## 2023-09-29 NOTE — Progress Notes (Signed)
 No show

## 2023-10-01 ENCOUNTER — Encounter: Payer: Self-pay | Admitting: Physician Assistant

## 2023-10-01 ENCOUNTER — Other Ambulatory Visit (HOSPITAL_COMMUNITY)
Admission: RE | Admit: 2023-10-01 | Discharge: 2023-10-01 | Disposition: A | Discharge status: Home / Self Care | Source: Ambulatory Visit | Attending: Physician Assistant | Admitting: Physician Assistant

## 2023-10-01 ENCOUNTER — Ambulatory Visit: Admitting: Physician Assistant

## 2023-10-01 VITALS — BP 123/79 | HR 95 | Ht 63.0 in | Wt 202.0 lb

## 2023-10-01 DIAGNOSIS — Z30431 Encounter for routine checking of intrauterine contraceptive device: Secondary | ICD-10-CM

## 2023-10-01 LAB — POCT URINE PREGNANCY: Preg Test, Ur: NEGATIVE

## 2023-10-01 NOTE — Progress Notes (Signed)
 Lower pelvis pain and discomfort. Very painful (scale of 8) during intercourse recently. Pain continued into the next day. Husband able  to feel IUD during intercourse.   Considering removal, would like to discuss with provider today and have IUD assessed.   Pt states there is no itching but there is a bit of an odor. No discharge. No urinary symptoms.

## 2023-10-03 LAB — CERVICOVAGINAL ANCILLARY ONLY
Chlamydia: NEGATIVE
Comment: NEGATIVE
Comment: NEGATIVE
Comment: NORMAL
Neisseria Gonorrhea: NEGATIVE
Trichomonas: NEGATIVE

## 2023-10-05 ENCOUNTER — Ambulatory Visit: Payer: Self-pay | Admitting: Physician Assistant

## 2023-10-09 ENCOUNTER — Ambulatory Visit (HOSPITAL_COMMUNITY): Attending: Physician Assistant

## 2023-10-19 ENCOUNTER — Ambulatory Visit
Admission: RE | Admit: 2023-10-19 | Discharge: 2023-10-19 | Disposition: A | Discharge status: Home / Self Care | Source: Ambulatory Visit

## 2023-10-19 VITALS — BP 121/77 | HR 88 | Temp 98.5°F | Resp 19

## 2023-10-19 DIAGNOSIS — H6122 Impacted cerumen, left ear: Secondary | ICD-10-CM

## 2023-10-19 DIAGNOSIS — H60392 Other infective otitis externa, left ear: Secondary | ICD-10-CM

## 2023-10-19 MED ORDER — CIPROFLOXACIN-DEXAMETHASONE 0.3-0.1 % OT SUSP: 4.0000 [drp] | Freq: Two times a day (BID) | OTIC | 0 refills | Status: DC

## 2023-10-19 NOTE — Discharge Instructions (Signed)
  1. Infective otitis externa of left ear (Primary) - ciprofloxacin -dexamethasone  (CIPRODEX ) OTIC suspension; Place 4 drops into the left ear 2 (two) times daily.  Dispense: 7.5 mL; Refill: 0 - Take ibuprofen  600 mg every 6-8 hours or Tylenol  500 to 1000 mg every 8 hours as needed for pain secondary to otitis externa  2. Impacted cerumen of left ear - Ear wax removal completed in UC by RN, reevaluation of ear canal reveals some mild erythema to the canal, tympanic membrane pearly white, no erythema or perforation.  Presentation consistent with otitis externa secondary to cerumen impaction. -Continue to monitor symptoms for any change in severity if there is any escalation of current symptoms or development of new symptoms follow-up in ER for further evaluation and management.

## 2023-10-19 NOTE — ED Provider Notes (Signed)
 [facility]  Note:  This document was prepared using Dragon voice recognition software and may include unintentional dictation errors.  MRN: [id number] DOB: 2000/07/02  Subjective:   Paige Stevens is a 23 y.o. female presenting for left ear pain, itching, yellow drainage, pain and swelling to the lymph nodes as well as intermittent bloody drainage from the ear.  Patient reports that symptoms began on Friday.  Has not taken any over-the-counter medication to treat pain or ear symptoms.  Patient denies any nasal congestion, fever, body aches, fatigue, shortness of breath, chest pain, weakness, dizziness.  Patient denies any other secondary symptoms  No current facility-administered medications for this encounter.  Current Outpatient Medications:    ciprofloxacin -dexamethasone  (CIPRODEX ) OTIC suspension, Place 4 drops into the left ear 2 (two) times daily., Disp: 7.5 mL, Rfl: 0   escitalopram (LEXAPRO) 10 MG tablet, Take 10 mg by mouth daily., Disp: , Rfl:    ketoconazole  (NIZORAL ) 2 % cream, Apply 1 Application topically daily. (Patient not taking: Reported on 10/01/2023), Disp: 60 g, Rfl: 2   Allergies  Allergen Reactions   Latex Rash    Per prenatal records from [place]     Past Medical History:  Diagnosis Date   Anemia    Anxiety    Depression    Hypertension      Past Surgical History:  Procedure Laterality Date   NO PAST SURGERIES      Family History  Problem Relation Age of Onset   Hypothyroidism Mother    Hypertension Father    Diabetes Paternal Grandmother    Hypertension Paternal Grandmother    Hypertension Paternal Grandfather     Social History   Tobacco Use   Smoking status: Never   Smokeless tobacco: Never  Vaping Use   Vaping status: Never Used  Substance Use Topics   Alcohol use: Not Currently    Comment: occ prior to pregnancy   Drug use: Never    ROS Refer to HPI for ROS details.  Objective:    Vitals: BP  121/77   Pulse 88   Temp 98.5 F (36.9 C)   Resp 19   LMP 08/18/2023 (Approximate)   SpO2 98%   Physical Exam Vitals and nursing note reviewed.  Constitutional:      General: She is not in acute distress.    Appearance: Normal appearance. She is not ill-appearing.  HENT:     Head: Normocephalic.     Right Ear: Tympanic membrane, ear canal and external ear normal.     Left Ear: Tympanic membrane and external ear normal. There is impacted cerumen.     Nose: Nose normal.     Mouth/Throat:     Mouth: Mucous membranes are moist.     Pharynx: Oropharynx is clear.  Eyes:     Extraocular Movements: Extraocular movements intact.     Conjunctiva/sclera: Conjunctivae normal.  Cardiovascular:     Rate and Rhythm: Normal rate.  Pulmonary:     Effort: Pulmonary effort is normal. No respiratory distress.  Skin:    General: Skin is warm and dry.     Capillary Refill: Capillary refill takes less than 2 seconds.  Neurological:     General: No focal deficit present.     Mental Status: She is alert and oriented to person, place, and time.  Psychiatric:        Mood and Affect: Mood normal.        Behavior: Behavior normal.     Procedures  No results found for this or any previous visit (from the past 24 hours).  Assessment and Plan :     Discharge Instructions       1. Infective otitis externa of left ear (Primary) - ciprofloxacin -dexamethasone  (CIPRODEX ) OTIC suspension; Place 4 drops into the left ear 2 (two) times daily.  Dispense: 7.5 mL; Refill: 0 - Take ibuprofen  600 mg every 6-8 hours or Tylenol  500 to 1000 mg every 8 hours as needed for pain secondary to otitis externa  2. Impacted cerumen of left ear - Ear wax removal completed in UC by RN, reevaluation of ear canal reveals some mild erythema to the canal, tympanic membrane pearly white, no erythema or perforation.  Presentation consistent with otitis externa secondary to cerumen impaction. -Continue to monitor symptoms  for any change in severity if there is any escalation of current symptoms or development of new symptoms follow-up in ER for further evaluation and management.      Lianni Kanaan B Sherryn Pollino   Kelyn Ponciano, [place], [place] 10/19/23 1028

## 2023-10-19 NOTE — ED Triage Notes (Signed)
 Pt presents to uc with co of left ear pain, itchiness, drainage and blood and swollen lymph node below the ear since Friday. Pt has not taken any otc.

## 2023-12-03 ENCOUNTER — Ambulatory Visit: Admitting: Obstetrics and Gynecology

## 2023-12-03 ENCOUNTER — Encounter: Payer: Self-pay | Admitting: Obstetrics and Gynecology

## 2023-12-03 VITALS — BP 111/76 | HR 83 | Wt 203.0 lb

## 2023-12-03 DIAGNOSIS — Z30432 Encounter for removal of intrauterine contraceptive device: Secondary | ICD-10-CM | POA: Diagnosis not present

## 2023-12-03 MED ORDER — NORETHIN-ETH ESTRAD-FE BIPHAS 1 MG-10 MCG / 10 MCG PO TABS: 1.0000 | ORAL_TABLET | Freq: Every day | ORAL | 11 refills | Status: AC

## 2023-12-03 NOTE — Progress Notes (Signed)
 Pt states she is having some discomfort with IUD, pain with intercourse and weight gain. Pt would like to go back to [facility] -  pt had [facility] in the past.

## 2023-12-03 NOTE — Progress Notes (Signed)
 [name] presenting for IUD removal. Patient has been unhappy with the Mirena  IUD reporting some discomfort and dyspareunia. Patient desires to restart birth control pills.   Past Medical History:  Diagnosis Date   Anemia    Anxiety    Depression    Hypertension    Past Surgical History:  Procedure Laterality Date   NO PAST SURGERIES     Family History  Problem Relation Age of Onset   Hypothyroidism Mother    Hypertension Father    Diabetes Paternal Grandmother    Hypertension Paternal Grandmother    Hypertension Paternal Grandfather    Social History   Tobacco Use   Smoking status: Never   Smokeless tobacco: Never  Vaping Use   Vaping status: Never Used  Substance Use Topics   Alcohol use: Not Currently    Comment: occ prior to pregnancy   Drug use: Never   ROS See pertinent in HPI. All other systems reviewed and non contributory Blood pressure 111/76, pulse 83, weight 203 lb (92.1 kg), not currently breastfeeding. GENERAL: Well-developed, well-nourished female in no acute distress.  ABDOMEN: Soft, nontender, nondistended. No organomegaly. PELVIC: Normal external female genitalia. Vagina is pink and rugated.  Normal discharge. Normal appearing cervix with IUD strings visualized at the os. Chaperone present during the pelvic exam EXTREMITIES: No cyanosis, clubbing, or edema, 2+ distal pulses.  A/P 23 yo here for IUD removal GYNECOLOGY CLINIC PROCEDURE NOTE  Paige Stevens is a 23 y.o. [phone] here for IUD removal. No GYN concerns.  Last pap smear was on 10/2021 and was normal.  IUD Removal  Patient identified, informed consent performed, consent signed.  Patient was in the dorsal lithotomy position, normal external genitalia was noted.  A speculum was placed in the patient's vagina, normal discharge was noted, no lesions. The cervix was visualized, no lesions, no abnormal discharge.  The strings of the IUD were grasped and pulled using ring  forceps. The IUD was removed in its entirety. Patient tolerated the procedure well.    Patient will use Loestrin for contraception.  Routine preventative health maintenance measures emphasized.

## 2023-12-17 ENCOUNTER — Ambulatory Visit
Admission: RE | Admit: 2023-12-17 | Discharge: 2023-12-17 | Disposition: A | Discharge status: Home / Self Care | Source: Ambulatory Visit

## 2023-12-17 VITALS — BP 122/78 | HR 95 | Temp 98.2°F | Resp 16

## 2023-12-17 DIAGNOSIS — J0140 Acute pansinusitis, unspecified: Secondary | ICD-10-CM

## 2023-12-17 MED ORDER — AZELASTINE HCL 0.1 % NA SOLN: 1.0000 | Freq: Two times a day (BID) | NASAL | 1 refills | Status: AC

## 2023-12-17 MED ORDER — PROMETHAZINE-DM 6.25-15 MG/5ML PO SYRP: 10.0000 mL | ORAL_SOLUTION | Freq: Three times a day (TID) | ORAL | 0 refills | Status: AC | PRN

## 2023-12-17 MED ORDER — AMOXICILLIN-POT CLAVULANATE 875-125 MG PO TABS: 1.0000 | ORAL_TABLET | Freq: Two times a day (BID) | ORAL | 0 refills | Status: DC

## 2023-12-17 NOTE — ED Triage Notes (Signed)
 Pt c/o bilateral ear fullness, congestion with yellow mucous for 1.5 weeks.

## 2023-12-17 NOTE — ED Provider Notes (Signed)
 [facility]  Note:  This document was prepared using Dragon voice recognition software and may include unintentional dictation errors.  MRN: [id number] DOB: [birth year]  Subjective:   Paige Stevens is a 23 y.o. female presenting for nasal congestion, sinus pressure, bilateral ear fullness, headache x 10 to 12 days.  Patient reports that mucus from nasal congestion has bright yellow, occasionally she has bright yellow/green mucus that comes out of her throat.  Patient denies any fever, body aches, shortness of breath, chest pain.  Patient denies any known sick contacts.  Patient concern for possible sinusitis due to worsening nasal congestion and sinus pain.  No current facility-administered medications for this encounter.  Current Outpatient Medications:    amoxicillin -clavulanate (AUGMENTIN) 875-125 MG tablet, Take 1 tablet by mouth 2 (two) times daily for 7 days., Disp: 14 tablet, Rfl: 0   azelastine (ASTELIN) 0.1 % nasal spray, Place 1 spray into both nostrils 2 (two) times daily. Use in each nostril as directed, Disp: 30 mL, Rfl: 1   promethazine-dextromethorphan (PROMETHAZINE-DM) 6.25-15 MG/5ML syrup, Take 10 mLs by mouth 3 (three) times daily as needed., Disp: 240 mL, Rfl: 0   escitalopram (LEXAPRO) 10 MG tablet, Take 10 mg by mouth daily., Disp: , Rfl:    Norethindrone -Ethinyl Estradiol-Fe Biphas (LO LOESTRIN FE) 1 MG-10 MCG / 10 MCG tablet, Take 1 tablet by mouth daily., Disp: 28 tablet, Rfl: 11   Allergies  Allergen Reactions   Latex Rash    Per prenatal records from [place]     Past Medical History:  Diagnosis Date   Anemia    Anxiety    Depression    Hypertension      Past Surgical History:  Procedure Laterality Date   NO PAST SURGERIES      Family History  Problem Relation Age of Onset   Hypothyroidism Mother    Hypertension Father    Diabetes Paternal Grandmother    Hypertension Paternal Grandmother    Hypertension Paternal  Grandfather     Social History   Tobacco Use   Smoking status: Never   Smokeless tobacco: Never  Vaping Use   Vaping status: Never Used  Substance Use Topics   Alcohol use: Not Currently    Comment: occ prior to pregnancy   Drug use: Never    ROS Refer to HPI for ROS details.  Objective:   Vitals: BP 122/78 (BP Location: Right Arm)   Pulse 95   Temp 98.2 F (36.8 C) (Oral)   Resp 16   LMP 12/08/2023   SpO2 96%   Breastfeeding No   Physical Exam Vitals and nursing note reviewed.  Constitutional:      General: She is not in acute distress.    Appearance: She is well-developed. She is not ill-appearing or toxic-appearing.  HENT:     Head: Normocephalic and atraumatic.     Right Ear: Ear canal and external ear normal. Tenderness present. A middle ear effusion is present. Tympanic membrane is bulging. Tympanic membrane is not injected or erythematous.     Left Ear: Ear canal and external ear normal. Tenderness present. A middle ear effusion is present. Tympanic membrane is bulging. Tympanic membrane is not injected or erythematous.     Nose: Nasal tenderness, mucosal edema, congestion and rhinorrhea present.     Right Turbinates: Swollen.     Left Turbinates: Swollen.     Right Sinus: Maxillary sinus tenderness and frontal sinus tenderness present.     Left Sinus: Maxillary sinus  tenderness and frontal sinus tenderness present.     Mouth/Throat:     Mouth: Mucous membranes are moist.     Pharynx: Oropharynx is clear. Posterior oropharyngeal erythema and postnasal drip present. No oropharyngeal exudate.  Cardiovascular:     Rate and Rhythm: Normal rate.  Pulmonary:     Effort: Pulmonary effort is normal. No respiratory distress.     Breath sounds: No stridor. No wheezing.  Skin:    General: Skin is warm and dry.  Neurological:     General: No focal deficit present.     Mental Status: She is alert and oriented to person, place, and time.  Psychiatric:        Mood  and Affect: Mood normal.        Behavior: Behavior normal.     Procedures  No results found for this or any previous visit (from the past 24 hours).  No results found.   Assessment and Plan :     Discharge Instructions       1. Acute non-recurrent pansinusitis (Primary) - amoxicillin -clavulanate (AUGMENTIN) 875-125 MG tablet; Take 1 tablet by mouth 2 (two) times daily for 7 days.  Dispense: 14 tablet; Refill: 0 - azelastine (ASTELIN) 0.1 % nasal spray; Place 1 spray into both nostrils 2 (two) times daily. Use in each nostril as directed  Dispense: 30 mL; Refill: 1 - promethazine-dextromethorphan (PROMETHAZINE-DM) 6.25-15 MG/5ML syrup; Take 10 mLs by mouth 3 (three) times daily as needed.  Dispense: 240 mL; Refill: 0 - Take daily antihistamine medication such as Claritin , Zyrtec, Allegra  for nasal congestion and inflammation secondary to sinus allergies and ibuprofen  600 mg every 8 hours as needed for inflammation and pain secondary to sinusitis -Continue to monitor symptoms for any change in severity if there is any escalation of current symptoms or development of new symptoms follow-up in ER for further evaluation and management.      Scout Guyett B Jacarie Pate   Dash Cardarelli, [place], [place] 12/17/23 [phone]

## 2023-12-17 NOTE — Discharge Instructions (Addendum)
  1. Acute non-recurrent pansinusitis (Primary) - amoxicillin -clavulanate (AUGMENTIN) 875-125 MG tablet; Take 1 tablet by mouth 2 (two) times daily for 7 days.  Dispense: 14 tablet; Refill: 0 - azelastine (ASTELIN) 0.1 % nasal spray; Place 1 spray into both nostrils 2 (two) times daily. Use in each nostril as directed  Dispense: 30 mL; Refill: 1 - promethazine-dextromethorphan (PROMETHAZINE-DM) 6.25-15 MG/5ML syrup; Take 10 mLs by mouth 3 (three) times daily as needed.  Dispense: 240 mL; Refill: 0 - Take daily antihistamine medication such as Claritin , Zyrtec, Allegra  for nasal congestion and inflammation secondary to sinus allergies and ibuprofen  600 mg every 8 hours as needed for inflammation and pain secondary to sinusitis -Continue to monitor symptoms for any change in severity if there is any escalation of current symptoms or development of new symptoms follow-up in ER for further evaluation and management.

## 2023-12-24 ENCOUNTER — Ambulatory Visit
Admission: RE | Admit: 2023-12-24 | Discharge: 2023-12-24 | Disposition: A | Discharge status: Home / Self Care | Source: Ambulatory Visit | Attending: Family Medicine | Admitting: Family Medicine

## 2023-12-24 ENCOUNTER — Encounter (HOSPITAL_BASED_OUTPATIENT_CLINIC_OR_DEPARTMENT_OTHER): Payer: Self-pay

## 2023-12-24 ENCOUNTER — Emergency Department (HOSPITAL_BASED_OUTPATIENT_CLINIC_OR_DEPARTMENT_OTHER)
Admission: EM | Admit: 2023-12-24 | Discharge: 2023-12-24 | Disposition: A | Discharge status: Home / Self Care | Attending: Emergency Medicine | Admitting: Emergency Medicine

## 2023-12-24 ENCOUNTER — Other Ambulatory Visit: Payer: Self-pay

## 2023-12-24 ENCOUNTER — Emergency Department (HOSPITAL_BASED_OUTPATIENT_CLINIC_OR_DEPARTMENT_OTHER)

## 2023-12-24 VITALS — BP 131/86 | HR 88 | Temp 98.7°F | Resp 17

## 2023-12-24 DIAGNOSIS — J329 Chronic sinusitis, unspecified: Secondary | ICD-10-CM | POA: Diagnosis not present

## 2023-12-24 DIAGNOSIS — R42 Dizziness and giddiness: Secondary | ICD-10-CM | POA: Diagnosis not present

## 2023-12-24 DIAGNOSIS — Z91040 Latex allergy status: Secondary | ICD-10-CM | POA: Diagnosis not present

## 2023-12-24 DIAGNOSIS — I10 Essential (primary) hypertension: Secondary | ICD-10-CM | POA: Diagnosis not present

## 2023-12-24 DIAGNOSIS — R519 Headache, unspecified: Secondary | ICD-10-CM | POA: Diagnosis present

## 2023-12-24 LAB — BASIC METABOLIC PANEL WITH GFR
Anion gap: 8 (ref 5–15)
BUN: 8 mg/dL (ref 6–20)
CO2: 26 mmol/L (ref 22–32)
Calcium: 9.6 mg/dL (ref 8.9–10.3)
Chloride: 105 mmol/L (ref 98–111)
Creatinine, Ser: 0.68 mg/dL (ref 0.44–1.00)
GFR, Estimated: >60 mL/min (ref >60)
Glucose, Bld: 94 mg/dL (ref 70–99)
Potassium: 4.1 mmol/L (ref 3.5–5.1)
Sodium: 139 mmol/L (ref 135–145)

## 2023-12-24 LAB — CBC WITH DIFFERENTIAL/PLATELET
Abs Immature Granulocytes: 0.03 K/uL (ref 0.00–0.07)
Basophils Absolute: 0.1 K/uL (ref 0.0–0.1)
Basophils Relative: 1 %
Eosinophils Absolute: 0.3 K/uL (ref 0.0–0.5)
Eosinophils Relative: 3 %
HCT: 35.6 % — ABNORMAL LOW (ref 36.0–46.0)
Hemoglobin: 11.9 g/dL — ABNORMAL LOW (ref 12.0–15.0)
Immature Granulocytes: 0 %
Lymphocytes Relative: 30 %
Lymphs Abs: 2.6 K/uL (ref 0.7–4.0)
MCH: 30.2 pg (ref 26.0–34.0)
MCHC: 33.4 g/dL (ref 30.0–36.0)
MCV: 90.4 fL (ref 80.0–100.0)
Monocytes Absolute: 0.7 K/uL (ref 0.1–1.0)
Monocytes Relative: 8 %
Neutro Abs: 5 K/uL (ref 1.7–7.7)
Neutrophils Relative %: 58 %
Platelets: 301 K/uL (ref 150–400)
RBC: 3.94 MIL/uL (ref 3.87–5.11)
RDW: 12.4 % (ref 11.5–15.5)
WBC: 8.6 K/uL (ref 4.0–10.5)
nRBC: 0 % (ref 0.0–0.2)

## 2023-12-24 LAB — URINALYSIS, ROUTINE W REFLEX MICROSCOPIC
Bacteria, UA: NONE SEEN
Bilirubin Urine: NEGATIVE
Glucose, UA: NEGATIVE mg/dL
Hgb urine dipstick: NEGATIVE
Ketones, ur: NEGATIVE mg/dL
Leukocytes,Ua: NEGATIVE
Nitrite: NEGATIVE
Protein, ur: NEGATIVE mg/dL
Specific Gravity, Urine: 1.015 (ref 1.005–1.030)
pH: 7.5 (ref 5.0–8.0)

## 2023-12-24 LAB — PREGNANCY, URINE: Preg Test, Ur: NEGATIVE

## 2023-12-24 MED ORDER — AMOXICILLIN-POT CLAVULANATE 875-125 MG PO TABS: 1.0000 | ORAL_TABLET | Freq: Two times a day (BID) | ORAL | 0 refills | Status: AC

## 2023-12-24 MED ORDER — KETOROLAC TROMETHAMINE 15 MG/ML IJ SOLN
15.0000 mg | Freq: Once | INTRAMUSCULAR | Status: AC
  Administered 2023-12-24: 15 mg via INTRAVENOUS
  Filled 2023-12-24: qty 1

## 2023-12-24 MED ORDER — IBUPROFEN 600 MG PO TABS: 600.0000 mg | ORAL_TABLET | Freq: Four times a day (QID) | ORAL | 0 refills | Status: AC | PRN

## 2023-12-24 MED ORDER — SODIUM CHLORIDE 0.9 % IV BOLUS
1000.0000 mL | Freq: Once | INTRAVENOUS | Status: AC
  Administered 2023-12-24: 1000 mL via INTRAVENOUS

## 2023-12-24 MED ORDER — DIPHENHYDRAMINE HCL 50 MG/ML IJ SOLN
50.0000 mg | Freq: Once | INTRAMUSCULAR | Status: AC
  Administered 2023-12-24: 50 mg via INTRAVENOUS
  Filled 2023-12-24: qty 1

## 2023-12-24 MED ORDER — METOCLOPRAMIDE HCL 5 MG/ML IJ SOLN
10.0000 mg | Freq: Once | INTRAMUSCULAR | Status: AC
  Administered 2023-12-24: 10 mg via INTRAVENOUS
  Filled 2023-12-24: qty 2

## 2023-12-24 NOTE — ED Notes (Signed)
 Pt given discharge instructions. Opportunities given for questions. IV removed. Pt stable at time of discharge.

## 2023-12-24 NOTE — Discharge Instructions (Addendum)
 Please go to the emergency room for further evaluation of your persistent new onset headache

## 2023-12-24 NOTE — ED Triage Notes (Signed)
 Pt reports headache x2 weeks. Pt reports no relief with OTC meds. Pt denies any hx of migraines

## 2023-12-24 NOTE — ED Triage Notes (Signed)
 Pt c/o HA on right side of head, photosensitivity, right eye twitching, [id number]. Pt states has tried the OTC meds and home remedies and nothing is helping.

## 2023-12-24 NOTE — Discharge Instructions (Addendum)
 You have been evaluated for your symptoms.  CT scan today shows finding suggestive of sinusitis which certainly can contribute to headache.  You may take antibiotic as prescribed.  Take ibuprofen  as needed for your symptoms as well.

## 2023-12-24 NOTE — ED Provider Notes (Signed)
 [place] EMERGENCY DEPARTMENT AT [facility] Provider Note   CSN: [id number] Arrival date & time: 12/24/23  2005     Patient presents with: Headache   Paige Stevens is a 23 y.o. female.   The history is provided by the patient and medical records. No language interpreter was used.  Headache    23 year old female with history of hypertension, anxiety, depression, sent here from urgent care center with concerns of headache.  Patient endorsed intermittent right sided headache behind her right eye ongoing for the past 2 weeks.  She also endorsed having light and sound sensitivity with blurry vision nausea and fatigue.  Headache usually last about the day seems to be improved in the morning and when she went to bed.  She tries numerous over-the-counter medication at home including Tylenol  ibuprofen  at home remedy without relief.  She has what appears to be a sinus infection 2 weeks ago precipitating this headache but states that sinus infection has resolved but headache still persist.  She does not endorse any fever or chills denies any neck stiffness denies any focal numbness or focal weakness or having any rash.  History reviewed never had any history of migraine.  Last menstruation was several weeks ago.  Prior to Admission medications   Medication Sig Start Date End Date Taking? Authorizing Provider  amoxicillin -clavulanate (AUGMENTIN) 875-125 MG tablet Take 1 tablet by mouth 2 (two) times daily for 7 days. 12/17/23 12/24/23  Reddick, Johnathan B, NP  azelastine (ASTELIN) 0.1 % nasal spray Place 1 spray into both nostrils 2 (two) times daily. Use in each nostril as directed 12/17/23   Reddick, Johnathan B, NP  escitalopram (LEXAPRO) 10 MG tablet Take 10 mg by mouth daily. 08/19/23   [provider]  Norethindrone -Ethinyl Estradiol-Fe Biphas (LO LOESTRIN FE) 1 MG-10 MCG / 10 MCG tablet Take 1 tablet by mouth daily. 12/03/23   Constant, Peggy, MD   promethazine-dextromethorphan (PROMETHAZINE-DM) 6.25-15 MG/5ML syrup Take 10 mLs by mouth 3 (three) times daily as needed. 12/17/23   Aurea Goodell B, NP    Allergies: Latex    Review of Systems  Neurological:  Positive for headaches.  All other systems reviewed and are negative.   Updated Vital Signs BP (!) 133/92 (BP Location: Right Arm)   Pulse 90   Temp 98.3 F (36.8 C) (Temporal)   Resp 18   Ht 5' 3 (1.6 m)   Wt 90.7 kg   LMP 12/03/2023   SpO2 100%   BMI 35.43 kg/m   Physical Exam Vitals and nursing note reviewed.  Constitutional:      General: She is not in acute distress.    Appearance: She is well-developed.  HENT:     Head: Normocephalic and atraumatic.     Nose: Nose normal.     Mouth/Throat:     Mouth: Mucous membranes are moist.  Eyes:     Extraocular Movements: Extraocular movements intact.     Conjunctiva/sclera: Conjunctivae normal.     Pupils: Pupils are equal, round, and reactive to light.  Cardiovascular:     Rate and Rhythm: Normal rate and regular rhythm.     Pulses: Normal pulses.     Heart sounds: Normal heart sounds.  Pulmonary:     Effort: Pulmonary effort is normal.  Abdominal:     Palpations: Abdomen is soft.     Tenderness: There is no abdominal tenderness.  Musculoskeletal:        General: Normal range of motion.  Cervical back: Normal range of motion and neck supple. No rigidity or tenderness.  Lymphadenopathy:     Cervical: No cervical adenopathy.  Skin:    Findings: No rash.  Neurological:     Mental Status: She is alert.     Comments: Neurologic exam:  Speech clear, pupils equal round reactive to light, extraocular movements intact  Normal peripheral visual fields Cranial nerves III through XII normal including no facial droop Follows commands, moves all extremities x4, normal strength to bilateral upper and lower extremities at all major muscle groups including grip Sensation normal to light touch  Coordination  intact, no limb ataxia, finger-nose-finger normal No pronator drift Gait normal   Psychiatric:        Mood and Affect: Mood normal.     (all labs ordered are listed, but only abnormal results are displayed) Labs Reviewed  CBC WITH DIFFERENTIAL/PLATELET - Abnormal; Notable for the following components:      Result Value   Hemoglobin 11.9 (*)    HCT 35.6 (*)    All other components within normal limits  BASIC METABOLIC PANEL WITH GFR  PREGNANCY, URINE  URINALYSIS, ROUTINE W REFLEX MICROSCOPIC    EKG: None  Radiology: CT Head Wo Contrast Result Date: 12/24/2023 EXAM: CT HEAD WITHOUT CONTRAST 12/24/2023 09:31:51 PM TECHNIQUE: CT of the head was performed without the administration of intravenous contrast. Automated exposure control, iterative reconstruction, and/or weight based adjustment of the mA/kV was utilized to reduce the radiation dose to as low as reasonably achievable. COMPARISON: None available. CLINICAL HISTORY: Headache, new onset (Age >= 51y). Pt reports headache x2 weeks. Pt reports no relief with OTC meds. Pt denies any hx of migraines. FINDINGS: BRAIN AND VENTRICLES: No acute hemorrhage. No evidence of acute infarct. No hydrocephalus. No extra-axial collection. No mass effect or midline shift. ORBITS: No acute abnormality. SINUSES: Near complete opacification of the right maxillary sinus and both sphenoid sinuses. Mucosal thickening in the ethmoid air cells and left maxillary sinus. SOFT TISSUES AND SKULL: No acute soft tissue abnormality. No skull fracture. Partial opacification of the mastoid air cells bilaterally. IMPRESSION: 1. No acute intracranial abnormality. 2. Extensive mucosal thickening in the paranasal sinuses. Correlate for acute sinusitis. 3. Partial opacification of the bilateral mastoid air cells. Electronically signed by: Norman Gatlin MD 12/24/2023 09:36 PM EDT RP Workstation: [id number]     Procedures   Medications Ordered in the ED  ketorolac  (TORADOL) 15 MG/ML injection 15 mg (15 mg Intravenous Given 12/24/23 2119)  metoCLOPramide (REGLAN) injection 10 mg (10 mg Intravenous Given 12/24/23 2119)  diphenhydrAMINE  (BENADRYL ) injection 50 mg (50 mg Intravenous Given 12/24/23 2119)  sodium chloride  0.9 % bolus 1,000 mL (0 mLs Intravenous Stopped 12/24/23 2300)                                    Medical Decision Making  BP (!) 133/92 (BP Location: Right Arm)   Pulse 90   Temp 98.3 F (36.8 C) (Temporal)   Resp 18   Ht 5' 3 (1.6 m)   Wt 90.7 kg   LMP 12/03/2023   SpO2 100%   BMI 35.43 kg/m   10:23 PM 23 year old female with history of hypertension, anxiety, depression, sent here from urgent care center with concerns of headache.  Patient endorsed intermittent right sided headache behind her right eye ongoing for the past 2 weeks.  She also endorsed having light and sound  sensitivity with blurry vision nausea and fatigue.  Headache usually last about the day seems to be improved in the morning and when she went to bed.  She tries numerous over-the-counter medication at home including Tylenol  ibuprofen  at home remedy without relief.  She has what appears to be a sinus infection 2 weeks ago precipitating this headache but states that sinus infection has resolved but headache still persist.  She does not endorse any fever or chills denies any neck stiffness denies any focal numbness or focal weakness or having any rash.  History reviewed never had any history of migraine.  Last menstruation was several weeks ago.  Exam overall reassuring patient without any nuchal rigidity concern for meningitis.  She has no focal neurodeficit.  She is mentating appropriately.  She has normal vision.  -Labs ordered, independently viewed and interpreted by me.  Labs remarkable for reassuring labs -The patient was maintained on a cardiac monitor.  I personally viewed and interpreted the cardiac monitored which showed an underlying rhythm of: Normal sinus  rhythm -Imaging independently viewed and interpreted by me and I agree with radiologist's interpretation.  Result remarkable for head CT scan without acute intracranial abnormalities but patient does have extensive mucosal thickening of the paranasal sinus suggestive of sinusitis -This patient presents to the ED for concern of headache, this involves an extensive number of treatment options, and is a complaint that carries with it a high risk of complications and morbidity.  The differential diagnosis includes tension headache, cluster headache, migraine, SAH, space occupying lesion, sinusitis,  -Co morbidities that complicate the patient evaluation includes HTN, anxiety, depression -Treatment includes toradol, reglan, benadryl , IVF.  -Reevaluation of the patient after these medicines showed that the patient improved -PCP office notes or outside notes reviewed -Escalation to admission/observation considered: patients feels much better, is comfortable with discharge, and will follow up with PCP -Prescription medication considered, patient comfortable with ibuprofen  and augmentin -Social Determinant of Health considered       Final diagnoses:  Sinusitis, unspecified chronicity, unspecified location    ED Discharge Orders          Ordered    amoxicillin -clavulanate (AUGMENTIN) 875-125 MG tablet  Every 12 hours        12/24/23 2338    ibuprofen  (ADVIL ) 600 MG tablet  Every 6 hours PRN        12/24/23 2338               Nivia Colon, PA-C 12/24/23 2339    Emil Share, DO 12/26/23 2300

## 2023-12-24 NOTE — ED Provider Notes (Signed)
 [facility]    CSN: [id number] Arrival date & time: 12/24/23  1825      History   Chief Complaint Chief Complaint  Patient presents with   Headache    Ongoing headache for two weeks with constant eye twitching from the right eye. Also very nauseous. Mostly the right side of my head and face. Meds, caffeine or sleep does not help or ease it. - Entered by patient    HPI Paige Stevens is a 23 y.o. female with a past medical history of anxiety, depression, hypertension presents for new onset headache.  Patient reports 2 weeks of an intermittent persistent right sided headache that begins behind her right eye and radiates to the right side of her head.  It is associated with nausea, photophobia, dizziness, blurry vision, fatigue.  No syncope or vomiting.  No neck pain fevers.  She thought it was a sinus infection and was treated with antibiotics which she is currently on without improvement.  She denies any history of headaches or migraines and denies any family history of headaches or migraines.  No history of first-degree relative with SAH.  Denies thunderclap headache.  She has been taking over-the-counter Tylenol  ibuprofen  with no improvement.  She states today her headache was a 10 out of 10 and was the worst headache of her life, but does state now it is an 8 out of 10.   Headache Associated symptoms: dizziness, nausea and photophobia     Past Medical History:  Diagnosis Date   Anemia    Anxiety    Depression    Hypertension     Patient Active Problem List   Diagnosis Date Noted   Gestational hypertension 05/09/2022   History of gestational hypertension 10/26/2021   Family history of thyroid  cancer 11/13/2015    Past Surgical History:  Procedure Laterality Date   NO PAST SURGERIES      OB History     Gravida  2   Para  2   Term  2   Preterm      AB      Living  2      SAB      IAB      Ectopic      Multiple  0   Live Births   2            Home Medications    Prior to Admission medications   Medication Sig Start Date End Date Taking? Authorizing Provider  amoxicillin -clavulanate (AUGMENTIN) 875-125 MG tablet Take 1 tablet by mouth 2 (two) times daily for 7 days. 12/17/23 12/24/23  Reddick, Johnathan B, NP  azelastine (ASTELIN) 0.1 % nasal spray Place 1 spray into both nostrils 2 (two) times daily. Use in each nostril as directed 12/17/23   Reddick, Johnathan B, NP  escitalopram (LEXAPRO) 10 MG tablet Take 10 mg by mouth daily. 08/19/23   [provider]  Norethindrone -Ethinyl Estradiol-Fe Biphas (LO LOESTRIN FE) 1 MG-10 MCG / 10 MCG tablet Take 1 tablet by mouth daily. 12/03/23   Constant, Peggy, MD  promethazine-dextromethorphan (PROMETHAZINE-DM) 6.25-15 MG/5ML syrup Take 10 mLs by mouth 3 (three) times daily as needed. 12/17/23   Aurea Ethel NOVAK, NP    Family History Family History  Problem Relation Age of Onset   Hypothyroidism Mother    Hypertension Father    Diabetes Paternal Grandmother    Hypertension Paternal Grandmother    Hypertension Paternal Grandfather     Social History Social History  Tobacco Use   Smoking status: Never   Smokeless tobacco: Never  Vaping Use   Vaping status: Never Used  Substance Use Topics   Alcohol use: Not Currently    Comment: occ prior to pregnancy   Drug use: Never     Allergies   Latex   Review of Systems Review of Systems  Eyes:  Positive for photophobia and visual disturbance.  Gastrointestinal:  Positive for nausea.  Neurological:  Positive for dizziness and headaches.     Physical Exam Triage Vital Signs ED Triage Vitals  Encounter Vitals Group     BP 12/24/23 1914 131/86     Girls Systolic BP Percentile --      Girls Diastolic BP Percentile --      Boys Systolic BP Percentile --      Boys Diastolic BP Percentile --      Pulse Rate 12/24/23 1914 88     Resp 12/24/23 1914 17     Temp 12/24/23 1914 98.7 F (37.1 C)      Temp Source 12/24/23 1914 Oral     SpO2 12/24/23 1914 98 %     Weight --      Height --      Head Circumference --      Peak Flow --      Pain Score 12/24/23 1912 8     Pain Loc --      Pain Education --      Exclude from Growth Chart --    No data found.  Updated Vital Signs BP 131/86   Pulse 88   Temp 98.7 F (37.1 C) (Oral)   Resp 17   LMP 12/03/2023   SpO2 98%   Breastfeeding No   Visual Acuity Right Eye Distance:   Left Eye Distance:   Bilateral Distance:    Right Eye Near:   Left Eye Near:    Bilateral Near:     Physical Exam Vitals and nursing note reviewed.  Constitutional:      General: She is not in acute distress.    Appearance: Normal appearance. She is not ill-appearing.  HENT:     Head: Normocephalic and atraumatic.  Eyes:     Extraocular Movements: Extraocular movements intact.     Conjunctiva/sclera: Conjunctivae normal.     Pupils: Pupils are equal, round, and reactive to light.  Cardiovascular:     Rate and Rhythm: Normal rate.  Pulmonary:     Effort: Pulmonary effort is normal.  Skin:    General: Skin is warm and dry.  Neurological:     General: No focal deficit present.     Mental Status: She is alert and oriented to person, place, and time.     GCS: GCS eye subscore is 4. GCS verbal subscore is 5. GCS motor subscore is 6.     Cranial Nerves: No facial asymmetry.     Motor: No weakness.     Coordination: Romberg sign positive. Finger-Nose-Finger Test normal.     Gait: Tandem walk normal.  Psychiatric:        Mood and Affect: Mood normal.        Behavior: Behavior normal.      UC Treatments / Results  Labs (all labs ordered are listed, but only abnormal results are displayed) Labs Reviewed - No data to display  EKG   Radiology No results found.  Procedures Procedures (including critical care time)  Medications Ordered in UC Medications - No data to display  Initial Impression / Assessment and Plan / UC Course  I  have reviewed the triage vital signs and the nursing notes.  Pertinent labs & imaging results that were available during my care of the patient were reviewed by me and considered in my medical decision making (see chart for details).     I reviewed exam and symptoms with patient.  Reviewed limitations and abilities of urgent care.  Patient presenting with new onset headaches with no history or family history of such.  States today headache was a 10 out of 10 with worst headache of her life.  Given this I advise she go to the emergency room for further evaluation and treatment.  She is in agreement plan will go POV to the emergency room. Final Clinical Impressions(s) / UC Diagnoses   Final diagnoses:  New onset headache  Dizziness     Discharge Instructions      Please go to the emergency room for further evaluation of your persistent new onset headache    ED Prescriptions   None    PDMP not reviewed this encounter.   Loreda Myla SAUNDERS, NP 12/24/23 [phone]
